# Patient Record
Sex: Male | Born: 1960
Health system: Southern US, Community
[De-identification: ages and names within clinical notes are randomized; demographics above are authoritative.]

## PROBLEM LIST (undated history)

## (undated) DIAGNOSIS — J45909 Unspecified asthma, uncomplicated: Secondary | ICD-10-CM

## (undated) HISTORY — PX: EYE SURGERY: SHX253

---

## 1997-09-03 ENCOUNTER — Ambulatory Visit (HOSPITAL_BASED_OUTPATIENT_CLINIC_OR_DEPARTMENT_OTHER): Admission: RE | Admit: 1997-09-03 | Discharge: 1997-09-03 | Payer: Self-pay | Admitting: General Surgery

## 1998-08-22 ENCOUNTER — Encounter: Admission: RE | Admit: 1998-08-22 | Discharge: 1998-08-22 | Payer: Self-pay | Admitting: Family Medicine

## 2014-11-17 ENCOUNTER — Encounter (HOSPITAL_COMMUNITY): Payer: Self-pay | Admitting: Emergency Medicine

## 2014-11-17 ENCOUNTER — Emergency Department (HOSPITAL_COMMUNITY)
Admission: EM | Admit: 2014-11-17 | Discharge: 2014-11-17 | Disposition: A | Discharge status: Home / Self Care | Payer: Medicare Other | Attending: Emergency Medicine | Admitting: Emergency Medicine

## 2014-11-17 DIAGNOSIS — Z7952 Long term (current) use of systemic steroids: Secondary | ICD-10-CM | POA: Insufficient documentation

## 2014-11-17 DIAGNOSIS — J45909 Unspecified asthma, uncomplicated: Secondary | ICD-10-CM | POA: Insufficient documentation

## 2014-11-17 DIAGNOSIS — K219 Gastro-esophageal reflux disease without esophagitis: Secondary | ICD-10-CM | POA: Diagnosis not present

## 2014-11-17 DIAGNOSIS — R0989 Other specified symptoms and signs involving the circulatory and respiratory systems: Secondary | ICD-10-CM | POA: Diagnosis present

## 2014-11-17 DIAGNOSIS — F458 Other somatoform disorders: Secondary | ICD-10-CM | POA: Diagnosis not present

## 2014-11-17 DIAGNOSIS — Z79899 Other long term (current) drug therapy: Secondary | ICD-10-CM | POA: Insufficient documentation

## 2014-11-17 DIAGNOSIS — R198 Other specified symptoms and signs involving the digestive system and abdomen: Secondary | ICD-10-CM

## 2014-11-17 HISTORY — DX: Unspecified asthma, uncomplicated: J45.909

## 2014-11-17 NOTE — ED Notes (Signed)
Pt reports at chicken 1430 yesterday; since felt like something stuck in throat but cannot cough it up; pt continues to reports nervous to attempt coughing up because of hx of choking. Pt denies SOB, difficulty breathing, or pain.

## 2014-11-17 NOTE — ED Notes (Signed)
Pt states he was eating some chicken with the small rib bones in it. States he thinks a bone got stuck in his throat. States he was afraid to go to sleep last night because he thought he'd swallow or choke on it. States he doesn't always feel it, that the feeling comes and goes, denies any pain in throat area or trouble breathing. Pt presents in no distress in triage.

## 2014-11-17 NOTE — ED Provider Notes (Signed)
CSN: [id number]     Arrival date & time 11/17/14  1820 History   First MD Initiated Contact with Patient 11/17/14 1901     Chief Complaint  Patient presents with  . Swallowed Foreign Body     (Consider location/radiation/quality/duration/timing/severity/associated sxs/prior Treatment) HPI   54 year old male with history of asthma who presents for evaluation of globus sensation. Patient states he ate some chicken last night. He believes he may have a bone stuck in his throat.  Report globus sensation in the back of his throat. He denies any significant pain but was concerned of possible choking on the foreign object. Sensation is waxing waning. He is able to tolerates both liquid and solid but at times he experiencing this globus sensation. Also reports his mouth is dry. No complaint of nausea, vomiting, hematemesis, chest pain, shortness of breath, or black stool. No complaint of abdominal pain or back pain. Patient without history of active cancer, denies abnormal weight changes, night sweats, or fever. Does report intermittent similar episodes in the past, never this severe. Does report having history of GERD, which he takes Gaviscon as needed.  Past Medical History  Diagnosis Date  . Asthma    Past Surgical History  Procedure Laterality Date  . Eye surgery      Legally Blind   History reviewed. No pertinent family history. History  Substance Use Topics  . Smoking status: Not on file  . Smokeless tobacco: Not on file  . Alcohol Use: Not on file    Review of Systems  All other systems reviewed and are negative.     Allergies  Codeine  Home Medications   Prior to Admission medications   Medication Sig Start Date End Date Taking? Authorizing Provider  brimonidine (ALPHAGAN) 0.2 % ophthalmic solution Place 1 drop into both eyes 3 (three) times daily. 10/12/14  Yes Historical Provider, MD  prednisoLONE acetate (PRED FORTE) 1 % ophthalmic suspension Place 1 drop into the right  eye 4 (four) times daily. 11/09/14  Yes Historical Provider, MD  timolol (BETIMOL) 0.25 % ophthalmic solution Place 1 drop into both eyes at bedtime.   Yes Historical Provider, MD   BP 165/85 mmHg  Pulse 81  Temp(Src) 98.2 F (36.8 C) (Oral)  Resp 18  SpO2 97% Physical Exam  Constitutional: He is oriented to person, place, and time. He appears well-developed and well-nourished. No distress.  HENT:  Head: Atraumatic.  Throat: Uvula is midline, no obvious obstruction noted. No trismus. No tongue edema.  Eyes: Conjunctivae are normal.  Neck: Neck supple. No JVD present. No tracheal deviation present. No thyromegaly present.  Cardiovascular: Normal rate and regular rhythm.   Pulmonary/Chest: Effort normal and breath sounds normal. No stridor. No respiratory distress.  Abdominal: Soft. Bowel sounds are normal. He exhibits no distension. There is no tenderness.  Lymphadenopathy:    He has no cervical adenopathy.  Neurological: He is alert and oriented to person, place, and time.  Skin: No rash noted.  Psychiatric: He has a normal mood and affect.  Nursing note and vitals reviewed.   ED Course  Procedures (including critical care time)  Patient here with globus sensation. He is able to tolerate both solids and liquids without difficulty. Low suspicion for retained foreign object such as chicken bone. I do not think x-ray can provide any significant clinical support. Reassurance given. GI referral given as needed. Return precautions discussed.  Labs Review Labs Reviewed - No data to display  Imaging Review No results found.  EKG Interpretation None      MDM   Final diagnoses:  Globus sensation    BP 165/85 mmHg  Pulse 81  Temp(Src) 98.2 F (36.8 C) (Oral)  Resp 18  SpO2 97%     Domenic Moras, PA-C 11/17/14 2008  Lacretia Leigh, MD 11/17/14 2126

## 2014-11-17 NOTE — Discharge Instructions (Signed)
Globus Syndrome  Globus Syndrome is a feeling of a lump or a sensation of something caught in your throat. Eating food or drinking fluids does not seem to get rid of it. Yet it is not noticeable during the actual act of swallowing food or liquids. Usually there is nothing physically wrong. It is troublesome because it is an unpleasant sensation which is sometimes difficult to ignore and at times may seem to worsen. The syndrome is quite common. It is estimated 45% of the population experiences features of the condition at some stage during their lives. The symptoms are usually temporary. The largest group of people who feel the need to seek medical treatment is females between the ages of 30 to 60.   CAUSES   Globus Syndrome appears to be triggered by or aggravated by stress, anxiety and depression.  · Tension related to stress could product abnormal muscle spasms in the esophagus which would account for the sensation of a lump or ball in your throat.  · Frequent swallowing or drying of the throat caused by anxiety or other strong emotions can also produce this uncomfortable sensation in your throat.  · Fear and sadness can be expressed by the body in many ways. For instance, if you had a relative with throat cancer you might become overly concerned about your own health and develop uncomfortable sensations in your throat.  · The reaction to a crisis or a trauma event in your life can take the form of a lump in your throat. It is as if you are indirectly saying you can not handle or "swallow" one more thing.  DIAGNOSIS   Usually your caregiver will know what is wrong by talking to you and examining you.  If the condition persists for several days, more testing may be done to make sure there is not another problem present. This is usually not the case.  TREATMENT   · Reassurance is often the best treatment available. Usually the problem leaves without treatment over several days.  · Sometimes anti-anxiety medications  may be prescribed.  · Counseling or talk therapy can also help with strong underlying emotions.  · Note that in most cases this is not something that keeps coming back and you should not be concerned or worried.  Document Released: 08/08/2003 Document Revised: 08/10/2011 Document Reviewed: 01/05/2008  ExitCare® Patient Information ©2015 [employer]. This information is not intended to replace advice given to you by your health care provider. Make sure you discuss any questions you have with your health care provider.

## 2015-08-05 DIAGNOSIS — R030 Elevated blood-pressure reading, without diagnosis of hypertension: Secondary | ICD-10-CM | POA: Diagnosis not present

## 2015-08-05 DIAGNOSIS — L739 Follicular disorder, unspecified: Secondary | ICD-10-CM | POA: Diagnosis not present

## 2015-08-26 DIAGNOSIS — N529 Male erectile dysfunction, unspecified: Secondary | ICD-10-CM | POA: Diagnosis not present

## 2015-08-26 DIAGNOSIS — Z125 Encounter for screening for malignant neoplasm of prostate: Secondary | ICD-10-CM | POA: Diagnosis not present

## 2015-08-26 DIAGNOSIS — Z13220 Encounter for screening for lipoid disorders: Secondary | ICD-10-CM | POA: Diagnosis not present

## 2015-08-26 DIAGNOSIS — Z0000 Encounter for general adult medical examination without abnormal findings: Secondary | ICD-10-CM | POA: Diagnosis not present

## 2015-08-26 DIAGNOSIS — Z8249 Family history of ischemic heart disease and other diseases of the circulatory system: Secondary | ICD-10-CM | POA: Diagnosis not present

## 2016-02-05 DIAGNOSIS — H401133 Primary open-angle glaucoma, bilateral, severe stage: Secondary | ICD-10-CM | POA: Diagnosis not present

## 2016-08-05 DIAGNOSIS — L309 Dermatitis, unspecified: Secondary | ICD-10-CM | POA: Diagnosis not present

## 2016-08-05 DIAGNOSIS — Z23 Encounter for immunization: Secondary | ICD-10-CM | POA: Diagnosis not present

## 2016-09-07 DIAGNOSIS — D485 Neoplasm of uncertain behavior of skin: Secondary | ICD-10-CM | POA: Diagnosis not present

## 2016-09-07 DIAGNOSIS — L821 Other seborrheic keratosis: Secondary | ICD-10-CM | POA: Diagnosis not present

## 2016-09-07 DIAGNOSIS — L739 Follicular disorder, unspecified: Secondary | ICD-10-CM | POA: Diagnosis not present

## 2017-08-03 DIAGNOSIS — H401133 Primary open-angle glaucoma, bilateral, severe stage: Secondary | ICD-10-CM | POA: Diagnosis not present

## 2017-08-03 DIAGNOSIS — H15833 Staphyloma posticum, bilateral: Secondary | ICD-10-CM | POA: Diagnosis not present

## 2017-08-03 DIAGNOSIS — H4423 Degenerative myopia, bilateral: Secondary | ICD-10-CM | POA: Diagnosis not present

## 2017-10-12 DIAGNOSIS — N529 Male erectile dysfunction, unspecified: Secondary | ICD-10-CM | POA: Diagnosis not present

## 2017-10-12 DIAGNOSIS — R350 Frequency of micturition: Secondary | ICD-10-CM | POA: Diagnosis not present

## 2017-10-12 DIAGNOSIS — Z136 Encounter for screening for cardiovascular disorders: Secondary | ICD-10-CM | POA: Diagnosis not present

## 2017-10-12 DIAGNOSIS — Z125 Encounter for screening for malignant neoplasm of prostate: Secondary | ICD-10-CM | POA: Diagnosis not present

## 2017-10-12 DIAGNOSIS — Z0000 Encounter for general adult medical examination without abnormal findings: Secondary | ICD-10-CM | POA: Diagnosis not present

## 2017-10-12 DIAGNOSIS — H409 Unspecified glaucoma: Secondary | ICD-10-CM | POA: Diagnosis not present

## 2018-02-10 DIAGNOSIS — H401133 Primary open-angle glaucoma, bilateral, severe stage: Secondary | ICD-10-CM | POA: Diagnosis not present

## 2018-03-05 ENCOUNTER — Emergency Department (HOSPITAL_COMMUNITY): Payer: Medicare HMO

## 2018-03-05 ENCOUNTER — Encounter (HOSPITAL_COMMUNITY): Payer: Self-pay | Admitting: Emergency Medicine

## 2018-03-05 ENCOUNTER — Observation Stay (HOSPITAL_COMMUNITY)
Admission: EM | Admit: 2018-03-05 | Discharge: 2018-03-07 | Disposition: A | Discharge status: Home / Self Care | Payer: Medicare HMO | Attending: Internal Medicine | Admitting: Internal Medicine

## 2018-03-05 DIAGNOSIS — H5440 Blindness, one eye, unspecified eye: Secondary | ICD-10-CM

## 2018-03-05 DIAGNOSIS — R69 Illness, unspecified: Secondary | ICD-10-CM | POA: Diagnosis not present

## 2018-03-05 DIAGNOSIS — H5450 Low vision, one eye, unspecified eye: Secondary | ICD-10-CM

## 2018-03-05 DIAGNOSIS — R030 Elevated blood-pressure reading, without diagnosis of hypertension: Secondary | ICD-10-CM | POA: Diagnosis present

## 2018-03-05 DIAGNOSIS — K219 Gastro-esophageal reflux disease without esophagitis: Secondary | ICD-10-CM | POA: Diagnosis not present

## 2018-03-05 DIAGNOSIS — E785 Hyperlipidemia, unspecified: Secondary | ICD-10-CM | POA: Diagnosis not present

## 2018-03-05 DIAGNOSIS — Z6833 Body mass index (BMI) 33.0-33.9, adult: Secondary | ICD-10-CM | POA: Insufficient documentation

## 2018-03-05 DIAGNOSIS — E669 Obesity, unspecified: Secondary | ICD-10-CM | POA: Insufficient documentation

## 2018-03-05 DIAGNOSIS — J9811 Atelectasis: Secondary | ICD-10-CM | POA: Diagnosis not present

## 2018-03-05 DIAGNOSIS — F419 Anxiety disorder, unspecified: Secondary | ICD-10-CM | POA: Diagnosis not present

## 2018-03-05 DIAGNOSIS — Z23 Encounter for immunization: Secondary | ICD-10-CM | POA: Diagnosis not present

## 2018-03-05 DIAGNOSIS — R0789 Other chest pain: Principal | ICD-10-CM | POA: Insufficient documentation

## 2018-03-05 DIAGNOSIS — Z79899 Other long term (current) drug therapy: Secondary | ICD-10-CM | POA: Diagnosis not present

## 2018-03-05 DIAGNOSIS — Z885 Allergy status to narcotic agent status: Secondary | ICD-10-CM | POA: Insufficient documentation

## 2018-03-05 DIAGNOSIS — R9431 Abnormal electrocardiogram [ECG] [EKG]: Secondary | ICD-10-CM | POA: Diagnosis not present

## 2018-03-05 DIAGNOSIS — R079 Chest pain, unspecified: Secondary | ICD-10-CM | POA: Diagnosis not present

## 2018-03-05 DIAGNOSIS — H548 Legal blindness, as defined in USA: Secondary | ICD-10-CM | POA: Diagnosis not present

## 2018-03-05 DIAGNOSIS — H547 Unspecified visual loss: Secondary | ICD-10-CM

## 2018-03-05 DIAGNOSIS — Z8249 Family history of ischemic heart disease and other diseases of the circulatory system: Secondary | ICD-10-CM | POA: Diagnosis not present

## 2018-03-05 LAB — CBC
HCT: 49.5 % (ref 39.0–52.0)
HEMOGLOBIN: 17.6 g/dL — AB (ref 13.0–17.0)
MCH: 32.1 pg (ref 26.0–34.0)
MCHC: 35.6 g/dL (ref 30.0–36.0)
MCV: 90.2 fL (ref 78.0–100.0)
PLATELETS: 268 10*3/uL (ref 150–400)
RBC: 5.49 MIL/uL (ref 4.22–5.81)
RDW: 12.4 % (ref 11.5–15.5)
WBC: 6.3 10*3/uL (ref 4.0–10.5)

## 2018-03-05 LAB — URINALYSIS, ROUTINE W REFLEX MICROSCOPIC
BILIRUBIN URINE: NEGATIVE
Glucose, UA: NEGATIVE mg/dL
HGB URINE DIPSTICK: NEGATIVE
KETONES UR: NEGATIVE mg/dL
Leukocytes, UA: NEGATIVE
NITRITE: NEGATIVE
Protein, ur: NEGATIVE mg/dL
Specific Gravity, Urine: 1.01 (ref 1.005–1.030)
pH: 8 (ref 5.0–8.0)

## 2018-03-05 LAB — BASIC METABOLIC PANEL
Anion gap: 11 (ref 5–15)
BUN: 10 mg/dL (ref 6–20)
CO2: 27 mmol/L (ref 22–32)
CREATININE: 0.96 mg/dL (ref 0.61–1.24)
Calcium: 10.1 mg/dL (ref 8.9–10.3)
Chloride: 104 mmol/L (ref 98–111)
GLUCOSE: 115 mg/dL — AB (ref 70–99)
Potassium: 5 mmol/L (ref 3.5–5.1)
Sodium: 142 mmol/L (ref 135–145)

## 2018-03-05 LAB — HEPATIC FUNCTION PANEL
ALT: 58 U/L — ABNORMAL HIGH (ref 0–44)
AST: 36 U/L (ref 15–41)
Albumin: 4.9 g/dL (ref 3.5–5.0)
Alkaline Phosphatase: 55 U/L (ref 38–126)
BILIRUBIN DIRECT: 0.1 mg/dL (ref 0.0–0.2)
BILIRUBIN INDIRECT: 0.7 mg/dL (ref 0.3–0.9)
BILIRUBIN TOTAL: 0.8 mg/dL (ref 0.3–1.2)
Total Protein: 8 g/dL (ref 6.5–8.1)

## 2018-03-05 LAB — CBG MONITORING, ED: Glucose-Capillary: 111 mg/dL — ABNORMAL HIGH (ref 70–99)

## 2018-03-05 LAB — I-STAT TROPONIN, ED: TROPONIN I, POC: 0 ng/mL (ref 0.00–0.08)

## 2018-03-05 LAB — RAPID URINE DRUG SCREEN, HOSP PERFORMED
Amphetamines: NOT DETECTED
Barbiturates: NOT DETECTED
Benzodiazepines: NOT DETECTED
COCAINE: NOT DETECTED
OPIATES: NOT DETECTED
TETRAHYDROCANNABINOL: NOT DETECTED

## 2018-03-05 LAB — TROPONIN I: Troponin I: <0.03 ng/mL (ref <0.03)

## 2018-03-05 LAB — LIPASE, BLOOD: LIPASE: 35 U/L (ref 11–51)

## 2018-03-05 MED ORDER — ASPIRIN 81 MG PO CHEW
324.0000 mg | CHEWABLE_TABLET | Freq: Once | ORAL | Status: AC
  Administered 2018-03-05: 324 mg via ORAL
  Filled 2018-03-05: qty 4

## 2018-03-05 MED ORDER — METOPROLOL TARTRATE 25 MG PO TABS: 25.0000 mg | ORAL_TABLET | Freq: Two times a day (BID) | ORAL | Status: DC

## 2018-03-05 MED ORDER — ACETAMINOPHEN 650 MG RE SUPP: 650.0000 mg | Freq: Four times a day (QID) | RECTAL | Status: DC | PRN

## 2018-03-05 MED ORDER — GUAIFENESIN ER 600 MG PO TB12
600.0000 mg | ORAL_TABLET | Freq: Two times a day (BID) | ORAL | Status: DC
  Administered 2018-03-05 – 2018-03-07 (×4): 600 mg via ORAL
  Filled 2018-03-05 (×4): qty 1

## 2018-03-05 MED ORDER — HYDROCODONE-ACETAMINOPHEN 5-325 MG PO TABS: 1.0000 | ORAL_TABLET | ORAL | Status: DC | PRN

## 2018-03-05 MED ORDER — ENOXAPARIN SODIUM 40 MG/0.4ML ~~LOC~~ SOLN
40.0000 mg | SUBCUTANEOUS | Status: DC
  Administered 2018-03-05 – 2018-03-06 (×2): 40 mg via SUBCUTANEOUS
  Filled 2018-03-05 (×2): qty 0.4

## 2018-03-05 MED ORDER — GI COCKTAIL ~~LOC~~
30.0000 mL | Freq: Once | ORAL | Status: DC
  Filled 2018-03-05: qty 30

## 2018-03-05 MED ORDER — INFLUENZA VAC SPLIT QUAD 0.5 ML IM SUSY
0.5000 mL | PREFILLED_SYRINGE | INTRAMUSCULAR | Status: AC
  Administered 2018-03-06: 0.5 mL via INTRAMUSCULAR
  Filled 2018-03-05: qty 0.5

## 2018-03-05 MED ORDER — DORZOLAMIDE HCL 2 % OP SOLN
1.0000 [drp] | Freq: Two times a day (BID) | OPHTHALMIC | Status: DC
  Administered 2018-03-05 – 2018-03-07 (×4): 1 [drp] via OPHTHALMIC
  Filled 2018-03-05: qty 10

## 2018-03-05 MED ORDER — LATANOPROST 0.005 % OP SOLN
1.0000 [drp] | Freq: Every day | OPHTHALMIC | Status: DC
  Administered 2018-03-05 – 2018-03-06 (×2): 1 [drp] via OPHTHALMIC
  Filled 2018-03-05: qty 2.5

## 2018-03-05 MED ORDER — TIMOLOL MALEATE 0.5 % OP SOLN
1.0000 [drp] | Freq: Two times a day (BID) | OPHTHALMIC | Status: DC
  Administered 2018-03-05 – 2018-03-07 (×4): 1 [drp] via OPHTHALMIC
  Filled 2018-03-05: qty 5

## 2018-03-05 MED ORDER — BRIMONIDINE TARTRATE 0.2 % OP SOLN
1.0000 [drp] | Freq: Three times a day (TID) | OPHTHALMIC | Status: DC
  Administered 2018-03-05 – 2018-03-07 (×5): 1 [drp] via OPHTHALMIC
  Filled 2018-03-05: qty 5

## 2018-03-05 MED ORDER — LORATADINE 10 MG PO TABS
10.0000 mg | ORAL_TABLET | Freq: Every day | ORAL | Status: DC
  Administered 2018-03-05 – 2018-03-07 (×3): 10 mg via ORAL
  Filled 2018-03-05 (×4): qty 1

## 2018-03-05 MED ORDER — ALPRAZOLAM 0.25 MG PO TABS: 0.2500 mg | ORAL_TABLET | Freq: Every evening | ORAL | Status: DC | PRN

## 2018-03-05 MED ORDER — ACETAMINOPHEN 325 MG PO TABS: 650.0000 mg | ORAL_TABLET | Freq: Four times a day (QID) | ORAL | Status: DC | PRN

## 2018-03-05 MED ORDER — SODIUM CHLORIDE 0.9 % IV SOLN
INTRAVENOUS | Status: AC
  Administered 2018-03-05: 22:00:00 via INTRAVENOUS

## 2018-03-05 MED ORDER — ONDANSETRON HCL 4 MG/2ML IJ SOLN: 4.0000 mg | Freq: Four times a day (QID) | INTRAMUSCULAR | Status: DC | PRN

## 2018-03-05 MED ORDER — PNEUMOCOCCAL VAC POLYVALENT 25 MCG/0.5ML IJ INJ
0.5000 mL | INJECTION | INTRAMUSCULAR | Status: AC
  Administered 2018-03-06: 0.5 mL via INTRAMUSCULAR
  Filled 2018-03-05: qty 0.5

## 2018-03-05 MED ORDER — ONDANSETRON HCL 4 MG PO TABS: 4.0000 mg | ORAL_TABLET | Freq: Four times a day (QID) | ORAL | Status: DC | PRN

## 2018-03-05 MED ORDER — METOPROLOL TARTRATE 25 MG PO TABS
12.5000 mg | ORAL_TABLET | Freq: Two times a day (BID) | ORAL | Status: DC
  Administered 2018-03-05: 12.5 mg via ORAL
  Filled 2018-03-05 (×2): qty 1

## 2018-03-05 MED ORDER — ALBUTEROL SULFATE (2.5 MG/3ML) 0.083% IN NEBU
2.5000 mg | INHALATION_SOLUTION | RESPIRATORY_TRACT | Status: DC | PRN
  Administered 2018-03-06: 2.5 mg via RESPIRATORY_TRACT
  Filled 2018-03-05: qty 3

## 2018-03-05 MED ORDER — TIMOLOL MALEATE 0.25 % OP SOLN
1.0000 [drp] | Freq: Every day | OPHTHALMIC | Status: DC
  Filled 2018-03-05: qty 5

## 2018-03-05 NOTE — H&P (Signed)
John Bell EZM:[id number] DOB: 1961/03/19 DOA: 03/05/2018     PCP: Dineen Kid, MD   Outpatient Specialists:  [facility]   Patient arrived to ER on 03/05/18 at 1335  Patient coming from: home Lives alone,    Chief Complaint:  Chief Complaint  Patient presents with  . Chest Pain    HPI: John Bell is a 57 y.o. male with medical history significant of Dysconjugate gaze optic neuritis family history of coronary artery disease    Presented with  Chest discomfort on a scale of 10 only 2-3  Describes a weird pressure-like feeling he is worried that he may be anxious no associated nausea vomiting. Reports occasionally wake up unable to take deep breath. He exercises on the regular bassis on Eliptic machine and have never had Shortness of breath or chest pain.  He at times have indigestion that make him feel similar Occasional dyspnea on exertion pain radiating to left arm.  Never had similar presentation in the past. Patient is concerned because his father required CABG at the same age.  Regarding pertinent Chronic problems: No prior history of coronary artery disease or diabetes of hypertension No prior echogram in the system While in ER: ECG: ST depressions T-waves inversion   The following Work up has been ordered so far:  Orders Placed This Encounter  Procedures  . DG Chest 2 View  . DG Chest Portable 1 View  . Basic metabolic panel  . CBC  . Hepatic function panel  . Lipase, blood  . Cardiac monitoring  . Saline Lock IV, Maintain IV access  . Cardiac monitoring  . Initiate Carrier Fluid Protocol  . Consult to hospitalist  . Pulse oximetry, continuous  . Pulse oximetry, continuous  . I-stat troponin, ED  . CBG monitoring, ED  . I-stat troponin, ED (0, 3)  . EKG 12-Lead  . ED EKG within 10 minutes  . Insert peripheral IV  . Insert peripheral IV    Following Medications were ordered in ER: Medications  aspirin chewable tablet 324 mg (324 mg Oral Given  03/05/18 1429)    Significant initial  Findings: Abnormal Labs Reviewed  BASIC METABOLIC PANEL - Abnormal; Notable for the following components:      Result Value   Glucose, Bld 115 (*)    All other components within normal limits  CBC - Abnormal; Notable for the following components:   Hemoglobin 17.6 (*)    All other components within normal limits  HEPATIC FUNCTION PANEL - Abnormal; Notable for the following components:   ALT 58 (*)    All other components within normal limits  CBG MONITORING, ED - Abnormal; Notable for the following components:   Glucose-Capillary 111 (*)    All other components within normal limits   Na 142 K 5.0  Cr     stable,    Lab Results  Component Value Date   CREATININE 0.96 03/05/2018   Lipase 35  WBC 6.3  HG/HCT     Component Value Date/Time   HGB 17.6 (H) 03/05/2018 1358   HCT 49.5 03/05/2018 1358     Troponin (Point of Care Test) Recent Labs    03/05/18 1436  TROPIPOC 0.00       UA ordered  CXR -  NON acute    ECG:  Personally reviewed by me showing: HR : 82 Rhythm: NSR,   nonspecific changes, T-wave inversions in multiple leads QTC 483    ED Triage Vitals  Enc Vitals  Group     BP 03/05/18 1341 (!) 199/166     Pulse Rate 03/05/18 1341 85     Resp 03/05/18 1341 19     Temp --      Temp src --      SpO2 03/05/18 1341 100 %     Weight --      Height --      Head Circumference --      Peak Flow --      Pain Score 03/05/18 1355 1     Pain Loc --      Pain Edu? --      Excl. in [place]? --   TMAX(24)@       Latest  Blood pressure (!) 159/83, pulse 72, resp. rate 16, SpO2 97 %.   ER Provider Called:  Cardiology    Dr. Gwenlyn Found    Will see in AM   Hospitalist was called for admission for chest pain evaluation    Review of Systems:    Pertinent positives include: chest pain, dyspnea on exertion  Constitutional:  No weight loss, night sweats, Fevers, chills, fatigue, weight loss  HEENT:  No headaches, Difficulty  swallowing,Tooth/dental problems, Sore throat,  No sneezing, itching, ear ache, nasal congestion, post nasal drip,  Cardio-vascular:  No  Orthopnea, PND, anasarca, dizziness, palpitations.no Bilateral lower extremity swelling  GI:  No heartburn, indigestion, abdominal pain, nausea, vomiting, diarrhea, change in bowel habits, loss of appetite, melena, blood in stool, hematemesis Resp:  no shortness of breath at rest. No , No excess mucus, no productive cough, No non-productive cough, No coughing up of blood.No change in color of mucus.No wheezing. Skin:  no rash or lesions. No jaundice GU:  no dysuria, change in color of urine, no urgency or frequency. No straining to urinate.  No flank pain.  Musculoskeletal:  No joint pain or no joint swelling. No decreased range of motion. No back pain.  Psych:  No change in mood or affect. No depression or anxiety. No memory loss.  Neuro: no localizing neurological complaints, no tingling, no weakness, no double vision, no gait abnormality, no slurred speech, no confusion  All systems reviewed and apart from [place] all are negative  Past Medical History:   Past Medical History:  Diagnosis Date  . Asthma       Past Surgical History:  Procedure Laterality Date  . EYE SURGERY     Legally Blind    Social History:  Ambulatory  Independently     reports that he has never smoked. He has never used smokeless tobacco. He reports that he does not drink alcohol or use drugs.     Family History:   Family History  Problem Relation Age of Onset  . CAD Father     Allergies: Allergies  Allergen Reactions  . Codeine Nausea Only     Prior to Admission medications   Medication Sig Start Date End Date Taking? Authorizing Provider  brimonidine (ALPHAGAN) 0.2 % ophthalmic solution Place 1 drop into both eyes 3 (three) times daily. 10/12/14   [provider]  prednisoLONE acetate (PRED FORTE) 1 % ophthalmic suspension Place 1 drop into  the right eye 4 (four) times daily. 11/09/14   [provider]  timolol (BETIMOL) 0.25 % ophthalmic solution Place 1 drop into both eyes at bedtime.    [provider]   Physical Exam: Blood pressure (!) 159/83, pulse 72, resp. rate 16, SpO2 97 %. 1. General:  in No  Acute distress  well -appearing  Disconjugate gaze 2. Psychological: Alert and   Oriented 3. Head/ENT:    Dry Mucous Membranes                          Head Non traumatic, neck supple                           Poor Dentition 4. SKIN:   decreased Skin turgor,  Skin clean Dry and intact no rash 5. Heart: Regular rate and rhythm no Murmur, no Rub or gallop 6. Lungs:  Clear to auscultation bilaterally, no wheezes or crackles   7. Abdomen: Soft,  non-tender, Non distended   obese  bowel sounds present 8. Lower extremities: no clubbing, cyanosis, or  edema 9. Neurologically Grossly intact, moving all 4 extremities equally   10. MSK: Normal range of motion   LABS:     Recent Labs  Lab 03/05/18 1358  WBC 6.3  HGB 17.6*  HCT 49.5  MCV 90.2  PLT 546   Basic Metabolic Panel: Recent Labs  Lab 03/05/18 1358  NA 142  K 5.0  CL 104  CO2 27  GLUCOSE 115*  BUN 10  CREATININE 0.96  CALCIUM 10.1      Recent Labs  Lab 03/05/18 1418  AST 36  ALT 58*  ALKPHOS 55  BILITOT 0.8  PROT 8.0  ALBUMIN 4.9   Recent Labs  Lab 03/05/18 1418  LIPASE 35   No results for input(s): AMMONIA in the last 168 hours.    HbA1C: No results for input(s): HGBA1C in the last 72 hours. CBG: Recent Labs  Lab 03/05/18 1428  GLUCAP 111*      Urine analysis: No results found for: COLORURINE, APPEARANCEUR, LABSPEC, PHURINE, GLUCOSEU, HGBUR, BILIRUBINUR, KETONESUR, PROTEINUR, UROBILINOGEN, NITRITE, LEUKOCYTESUR     Cultures: No results found for: [place], SPECREQUEST, CULT, REPTSTATUS   Radiological Exams on Admission: Dg Chest Portable 1 View  Result Date: 03/05/2018 CLINICAL DATA:  Central chest tightness  EXAM: PORTABLE CHEST 1 VIEW COMPARISON:  None. FINDINGS: Elevation of the left hemidiaphragm. Bibasilar atelectasis. Heart is normal size. No effusions or acute bony abnormality. IMPRESSION: Elevated left hemidiaphragm.  Bibasilar atelectasis. Electronically Signed   By: Rolm Baptise M.D.   On: 03/05/2018 14:40    Chart has been reviewed    Assessment/Plan   57 y.o. male with medical history significant of Dysconjugate gaze optic neuritis family history of coronary artery disease  Admitted for Chest pain evaluation   Present on Admission: . Chest pain - - H=   1  ,E= 1 , A=  1 , R   1 , T 0 *,  for the  Total of 4  therefore will admit for observation and further evaluation ( Risk of MACE: Scores 0-3  of 0.9-1.7%.,  4-6: 12-16.6% , Scores ?7: 50-65% )   - Other explanation for chest pain could be GI related versus musculoskeletal   - monitor on telemetry, cycle cardiac enzymes, obtain serial ECG and  ECHO in AM.   - Daily aspirin -  Further risk stratify with lipid panel, hgA1C, obtain TSH.  Make sure patient is on Aspirin.  We will notify cardiology regarding patient's admission. Further management depends on pending  workup Dr. Gwenlyn Found is aware . Elevated BP without diagnosis of hypertension -start beta-blocker and titrate as able . Anxiety -for now treated with Xanax as needed  but long-term wise probably will need different approach and follow-up with primary care   Other plan as per orders.  DVT prophylaxis:    Lovenox     Code Status:  FULL CODE  as per patient   I had personally discussed CODE STATUS with patient      Family Communication:   Family not at  Bedside    Disposition Plan:      To home once workup is complete and patient is stable                                    Consults called: Cardiology   Admission status:   Obs    Level of care   tele   24H        Tajuana Kniskern 03/05/2018, 4:55 PM    [facility]  Pager [phone]   after 2 AM  please page floor coverage PA If 7AM-7PM, please contact the day team taking care of the patient  [email or web address]  Password [email or web address]

## 2018-03-05 NOTE — ED Provider Notes (Addendum)
[facility] DEPT Provider Note   CSN: [id number] Arrival date & time: 03/05/18  1335     History   Chief Complaint Chief Complaint  Patient presents with  . Chest Pain    HPI John Bell is a 57 y.o. male.  HPI 57 year old man with family history of early coronary artery disease presents today with chest tightness that started this morning.  He was in the house doing some activities around the house and began to feel some lower chest tightness.  He has some associated dyspnea.  Had some discomfort in his left arm.  He denies any previous similar symptoms.  He has a strong family history of coronary artery disease.  He states his father was about his age when he required coronary artery bypass grafting.  He has had a EKG performed at primary care sometime in the distant past.  He denies any previous evaluation by cardiology. Past Medical History:  Diagnosis Date  . Asthma     There are no active problems to display for this patient.   Past Surgical History:  Procedure Laterality Date  . EYE SURGERY     Legally Blind        Home Medications    Prior to Admission medications   Medication Sig Start Date End Date Taking? Authorizing Provider  brimonidine (ALPHAGAN) 0.2 % ophthalmic solution Place 1 drop into both eyes 3 (three) times daily. 10/12/14   [provider]  prednisoLONE acetate (PRED FORTE) 1 % ophthalmic suspension Place 1 drop into the right eye 4 (four) times daily. 11/09/14   [provider]  timolol (BETIMOL) 0.25 % ophthalmic solution Place 1 drop into both eyes at bedtime.    [provider]    Family History No family history on file.  Social History Social History   Tobacco Use  . Smoking status: Never Smoker  . Smokeless tobacco: Never Used  Substance Use Topics  . Alcohol use: Never    Frequency: Never  . Drug use: Never     Allergies   Codeine   Review of Systems Review of  Systems  All other systems reviewed and are negative.    Physical Exam Updated Vital Signs BP (!) 179/101 (BP Location: Right Arm)   Pulse 84   Resp 20   SpO2 96%   Physical Exam  Constitutional: He is oriented to person, place, and time. He appears well-developed and well-nourished.  HENT:  Head: Normocephalic and atraumatic.  Eyes: EOM are normal.  Dysconjugate gaze  Neck: Normal range of motion. Neck supple.  Cardiovascular: Normal rate, regular rhythm and normal pulses.  Pulmonary/Chest: Effort normal and breath sounds normal.  Abdominal: Soft. Bowel sounds are normal.  Musculoskeletal: Normal range of motion.       Right lower leg: Normal.       Left lower leg: Normal.  Neurological: He is alert and oriented to person, place, and time.  Skin: Skin is warm and dry. Capillary refill takes less than 2 seconds.  Psychiatric: He has a normal mood and affect. His behavior is normal.  Nursing note and vitals reviewed.    ED Treatments / Results  Labs (all labs ordered are listed, but only abnormal results are displayed) Labs Reviewed  BASIC METABOLIC PANEL  CBC  HEPATIC FUNCTION PANEL  LIPASE, BLOOD  I-STAT TROPONIN, ED  CBG MONITORING, ED  I-STAT TROPONIN, ED    EKG EKG Interpretation  Date/Time:  Saturday March 05 2018 13:49:01 EDT  Ventricular Rate:  82 PR Interval:    QRS Duration: 119 QT Interval:  413 QTC Calculation: 483 R Axis:   38 Text Interpretation:  Normal sinus rhythm Non-specific ST-t changes twave inversion v3 and v4 No old tracing to compare Confirmed by Pattricia Boss [phone]) on 03/05/2018 2:02:56 PM   Radiology Dg Chest Portable 1 View  Result Date: 03/05/2018 CLINICAL DATA:  Central chest tightness EXAM: PORTABLE CHEST 1 VIEW COMPARISON:  None. FINDINGS: Elevation of the left hemidiaphragm. Bibasilar atelectasis. Heart is normal size. No effusions or acute bony abnormality. IMPRESSION: Elevated left hemidiaphragm.  Bibasilar atelectasis.  Electronically Signed   By: Rolm Baptise M.D.   On: 03/05/2018 14:40    Procedures Procedures (including critical care time)  Medications Ordered in ED Medications  aspirin chewable tablet 324 mg (has no administration in time range)     Initial Impression / Assessment and Plan / ED Course  I have reviewed the triage vital signs and the nursing notes.  Pertinent labs & imaging results that were available during my care of the patient were reviewed by me and considered in my medical decision making (see chart for details).     57 yo male with no known cardiac ho presents with sscp, pain resolved here.  ASA given.  EKG with t wave inversion and nst changes in v3-v4 disqualifying patient from heart pathway.  Plan admission for troponin cycling and further tesing.  Discussed with throat results with patient and wife Discussed with Dr. Roel Cluck and she will see for evaluation  Discussed with Dr. Gwenlyn Found and will have patient seen by cardiology in consult Final Clinical Impressions(s) / ED Diagnoses   Final diagnoses:  Chest pain, unspecified type  Abnormal EKG    ED Discharge Orders    None       Pattricia Boss, MD 03/05/18 1609    Pattricia Boss, MD 03/05/18 1630

## 2018-03-05 NOTE — ED Triage Notes (Signed)
Patient here from home with complaints of central chest tightness that start this morning. No nausea, vomiting. Patient states "there is no pain, just a weird feeling".

## 2018-03-05 NOTE — ED Notes (Signed)
ED TO INPATIENT HANDOFF REPORT  Name/Age/Gender John Bell 57 y.o. male  Code Status   Home/SNF/Other Home  Chief Complaint tightness in chest; L. arm tingling  Level of Care/Admitting Diagnosis ED Disposition    ED Disposition Condition [facility] Area: [place] [100102]  Level of Care: Telemetry [5]  Admit to tele based on following criteria: Other see comments  Admit to tele based on following criteria: Monitor for Ischemic changes  Comments: chest pain  Diagnosis: Chest pain [366440]  Admitting Physician: Toy Baker [[id number]]  Attending Physician: Toy Baker [[id number]]  PT Class (Do Not Modify): Observation [104]  PT Acc Code (Do Not Modify): Observation [10022]       Medical History Past Medical History:  Diagnosis Date  . Asthma     Allergies Allergies  Allergen Reactions  . Codeine Nausea Only    IV Location/Drains/Wounds Patient Lines/Drains/Airways Status   Active Line/Drains/Airways    Name:   Placement date:   Placement time:   Site:   Days:   Peripheral IV 03/05/18 Right Forearm   03/05/18    1430    Forearm   less than 1          Labs/Imaging Results for orders placed or performed during the hospital encounter of 03/05/18 (from the past 48 hour(s))  Basic metabolic panel     Status: Abnormal   Collection Time: 03/05/18  1:58 PM  Result Value Ref Range   Sodium 142 135 - 145 mmol/L   Potassium 5.0 3.5 - 5.1 mmol/L   Chloride 104 98 - 111 mmol/L   CO2 27 22 - 32 mmol/L   Glucose, Bld 115 (H) 70 - 99 mg/dL   BUN 10 6 - 20 mg/dL   Creatinine, Ser 0.96 0.61 - 1.24 mg/dL   Calcium 10.1 8.9 - 10.3 mg/dL   GFR calc non Af Amer >60 >60 mL/min   GFR calc Af Amer >60 >60 mL/min    Comment: (NOTE) The eGFR has been calculated using the CKD EPI equation. This calculation has not been validated in all clinical situations. eGFR's persistently <60 mL/min signify possible Chronic Kidney Disease.    Anion gap 11 5 - 15    Comment: Performed at [facility], [place] [address], [place], [place] [address]  CBC     Status: Abnormal   Collection Time: 03/05/18  1:58 PM  Result Value Ref Range   WBC 6.3 4.0 - 10.5 K/uL   RBC 5.49 4.22 - 5.81 MIL/uL   Hemoglobin 17.6 (H) 13.0 - 17.0 g/dL   HCT 49.5 39.0 - 52.0 %   MCV 90.2 78.0 - 100.0 fL   MCH 32.1 26.0 - 34.0 pg   MCHC 35.6 30.0 - 36.0 g/dL   RDW 12.4 11.5 - 15.5 %   Platelets 268 150 - 400 K/uL    Comment: Performed at [facility], [place] [address], [place], [place] [address]  Hepatic function panel     Status: Abnormal   Collection Time: 03/05/18  2:18 PM  Result Value Ref Range   Total Protein 8.0 6.5 - 8.1 g/dL   Albumin 4.9 3.5 - 5.0 g/dL   AST 36 15 - 41 U/L   ALT 58 (H) 0 - 44 U/L   Alkaline Phosphatase 55 38 - 126 U/L   Total Bilirubin 0.8 0.3 - 1.2 mg/dL   Bilirubin, Direct 0.1 0.0 - 0.2 mg/dL   Indirect Bilirubin 0.7  0.3 - 0.9 mg/dL    Comment: Performed at [facility], [place] [address] [place], [place] [address]  Lipase, blood     Status: None   Collection Time: 03/05/18  2:18 PM  Result Value Ref Range   Lipase 35 11 - 51 U/L    Comment: Performed at [facility], [place] [address] [place], [place] [address]  CBG monitoring, ED     Status: Abnormal   Collection Time: 03/05/18  2:28 PM  Result Value Ref Range   Glucose-Capillary 111 (H) 70 - 99 mg/dL  I-stat troponin, ED     Status: None   Collection Time: 03/05/18  2:36 PM  Result Value Ref Range   Troponin i, poc 0.00 0.00 - 0.08 ng/mL   Comment 3            Comment: Due to the release kinetics of cTnI, a negative result within the first hours of the onset of symptoms does not rule out myocardial infarction with certainty. If myocardial infarction is still suspected, repeat the test at appropriate intervals.   Troponin I (q 6hr x 3)     Status: None   Collection Time: 03/05/18  4:09  PM  Result Value Ref Range   Troponin I <0.03 <0.03 ng/mL    Comment: Performed at [facility], [place] [address] [place], [place] [address]  Urine rapid drug screen (hosp performed)     Status: None   Collection Time: 03/05/18  4:15 PM  Result Value Ref Range   Opiates NONE DETECTED NONE DETECTED   Cocaine NONE DETECTED NONE DETECTED   Benzodiazepines NONE DETECTED NONE DETECTED   Amphetamines NONE DETECTED NONE DETECTED   Tetrahydrocannabinol NONE DETECTED NONE DETECTED   Barbiturates NONE DETECTED NONE DETECTED    Comment: (NOTE) DRUG SCREEN FOR MEDICAL PURPOSES ONLY.  IF CONFIRMATION IS NEEDED FOR ANY PURPOSE, NOTIFY LAB WITHIN 5 DAYS. LOWEST DETECTABLE LIMITS FOR URINE DRUG SCREEN Drug Class                     Cutoff (ng/mL) Amphetamine and metabolites    1000 Barbiturate and metabolites    200 Benzodiazepine                 673 Tricyclics and metabolites     300 Opiates and metabolites        300 Cocaine and metabolites        300 THC                            50 Performed at [facility], [place] [address], [place], [place] [address]   Urinalysis, Routine w reflex microscopic     Status: None   Collection Time: 03/05/18  4:22 PM  Result Value Ref Range   Color, Urine YELLOW YELLOW   APPearance CLEAR CLEAR   Specific Gravity, Urine 1.010 1.005 - 1.030   pH 8.0 5.0 - 8.0   Glucose, UA NEGATIVE NEGATIVE mg/dL   Hgb urine dipstick NEGATIVE NEGATIVE   Bilirubin Urine NEGATIVE NEGATIVE   Ketones, ur NEGATIVE NEGATIVE mg/dL   Protein, ur NEGATIVE NEGATIVE mg/dL   Nitrite NEGATIVE NEGATIVE   Leukocytes, UA NEGATIVE NEGATIVE    Comment: Performed at [place] [address] [place],  [address]   Dg Chest Portable 1 View  Result Date: 03/05/2018 CLINICAL DATA:  Central chest tightness EXAM:  PORTABLE CHEST 1 VIEW COMPARISON:  None. FINDINGS: Elevation of the left hemidiaphragm. Bibasilar atelectasis. Heart  is normal size. No effusions or acute bony abnormality. IMPRESSION: Elevated left hemidiaphragm.  Bibasilar atelectasis. Electronically Signed   By: Rolm Baptise M.D.   On: 03/05/2018 14:40    Pending Labs Unresulted Labs (From admission, onward)    Start     Ordered   03/05/18 1609  Troponin I (q 6hr x 3)  Now then every 6 hours,   R     03/05/18 1608   Signed and Held  HIV antibody (Routine Testing)  Tomorrow morning,   R     Signed and Held   Signed and Held  Magnesium  Tomorrow morning,   R    Comments:  Call MD if <1.5    Signed and Held   Signed and Held  Phosphorus  Tomorrow morning,   R     Signed and Held   Signed and Held  TSH  Once,   R    Comments:  Cancel if already done within 1 month and notify MD    Signed and Held   Signed and Held  Comprehensive metabolic panel  Once,   R    Comments:  Cal MD for K<3.5 or >5.0    Signed and Held   Signed and Held  CBC  Once,   R    Comments:  Call for hg <8.0    Signed and Held          Vitals/Pain Today's Vitals   03/05/18 1700 03/05/18 1715 03/05/18 1730 03/05/18 1815  BP: (!) 150/83 137/87 (!) 153/100 (!) 162/96  Pulse:   80 78  Resp: (!) 23 17 (!) 22 20  SpO2: 97% 97% 94% 94%  PainSc:        Isolation Precautions No active isolations  Medications Medications  gi cocktail (Maalox,Lidocaine,Donnatal) (has no administration in time range)  ALPRAZolam (XANAX) tablet 0.25 mg (has no administration in time range)  aspirin chewable tablet 324 mg (324 mg Oral Given 03/05/18 1429)    Mobility walks

## 2018-03-05 NOTE — ED Notes (Signed)
ED Provider at bedside. 

## 2018-03-06 ENCOUNTER — Other Ambulatory Visit: Payer: Self-pay

## 2018-03-06 ENCOUNTER — Observation Stay (HOSPITAL_BASED_OUTPATIENT_CLINIC_OR_DEPARTMENT_OTHER): Payer: Medicare HMO

## 2018-03-06 DIAGNOSIS — E785 Hyperlipidemia, unspecified: Secondary | ICD-10-CM

## 2018-03-06 DIAGNOSIS — H548 Legal blindness, as defined in USA: Secondary | ICD-10-CM | POA: Diagnosis not present

## 2018-03-06 DIAGNOSIS — R079 Chest pain, unspecified: Secondary | ICD-10-CM | POA: Diagnosis not present

## 2018-03-06 DIAGNOSIS — R0789 Other chest pain: Secondary | ICD-10-CM | POA: Diagnosis not present

## 2018-03-06 DIAGNOSIS — K219 Gastro-esophageal reflux disease without esophagitis: Secondary | ICD-10-CM | POA: Diagnosis not present

## 2018-03-06 DIAGNOSIS — R9431 Abnormal electrocardiogram [ECG] [EKG]: Secondary | ICD-10-CM

## 2018-03-06 DIAGNOSIS — Z23 Encounter for immunization: Secondary | ICD-10-CM | POA: Diagnosis not present

## 2018-03-06 DIAGNOSIS — R030 Elevated blood-pressure reading, without diagnosis of hypertension: Secondary | ICD-10-CM | POA: Diagnosis not present

## 2018-03-06 DIAGNOSIS — R69 Illness, unspecified: Secondary | ICD-10-CM | POA: Diagnosis not present

## 2018-03-06 DIAGNOSIS — H5440 Blindness, one eye, unspecified eye: Secondary | ICD-10-CM | POA: Diagnosis not present

## 2018-03-06 DIAGNOSIS — F419 Anxiety disorder, unspecified: Secondary | ICD-10-CM | POA: Diagnosis not present

## 2018-03-06 DIAGNOSIS — Z8249 Family history of ischemic heart disease and other diseases of the circulatory system: Secondary | ICD-10-CM | POA: Diagnosis not present

## 2018-03-06 DIAGNOSIS — Z885 Allergy status to narcotic agent status: Secondary | ICD-10-CM | POA: Diagnosis not present

## 2018-03-06 DIAGNOSIS — H5450 Low vision, one eye, unspecified eye: Secondary | ICD-10-CM | POA: Diagnosis not present

## 2018-03-06 LAB — CBC
HEMATOCRIT: 45.9 % (ref 39.0–52.0)
Hemoglobin: 15.9 g/dL (ref 13.0–17.0)
MCH: 31.6 pg (ref 26.0–34.0)
MCHC: 34.6 g/dL (ref 30.0–36.0)
MCV: 91.3 fL (ref 78.0–100.0)
Platelets: 209 10*3/uL (ref 150–400)
RBC: 5.03 MIL/uL (ref 4.22–5.81)
RDW: 12.6 % (ref 11.5–15.5)
WBC: 6.9 10*3/uL (ref 4.0–10.5)

## 2018-03-06 LAB — COMPREHENSIVE METABOLIC PANEL
ALBUMIN: 3.9 g/dL (ref 3.5–5.0)
ALT: 46 U/L — ABNORMAL HIGH (ref 0–44)
AST: 31 U/L (ref 15–41)
Alkaline Phosphatase: 45 U/L (ref 38–126)
Anion gap: 10 (ref 5–15)
BUN: 10 mg/dL (ref 6–20)
CHLORIDE: 102 mmol/L (ref 98–111)
CO2: 28 mmol/L (ref 22–32)
Calcium: 9.1 mg/dL (ref 8.9–10.3)
Creatinine, Ser: 0.87 mg/dL (ref 0.61–1.24)
GFR calc Af Amer: >60 mL/min (ref >60)
GFR calc non Af Amer: >60 mL/min (ref >60)
GLUCOSE: 101 mg/dL — AB (ref 70–99)
POTASSIUM: 4.2 mmol/L (ref 3.5–5.1)
SODIUM: 140 mmol/L (ref 135–145)
Total Bilirubin: 0.9 mg/dL (ref 0.3–1.2)
Total Protein: 6.5 g/dL (ref 6.5–8.1)

## 2018-03-06 LAB — LIPID PANEL
CHOLESTEROL: 206 mg/dL — AB (ref 0–200)
HDL: 40 mg/dL — AB (ref >40)
LDL Cholesterol: 140 mg/dL — ABNORMAL HIGH (ref 0–99)
TRIGLYCERIDES: 130 mg/dL (ref <150)
Total CHOL/HDL Ratio: 5.2 RATIO
VLDL: 26 mg/dL (ref 0–40)

## 2018-03-06 LAB — ECHOCARDIOGRAM COMPLETE

## 2018-03-06 LAB — TSH: TSH: 1.691 u[IU]/mL (ref 0.350–4.500)

## 2018-03-06 LAB — MAGNESIUM: Magnesium: 2.1 mg/dL (ref 1.7–2.4)

## 2018-03-06 LAB — HIV ANTIBODY (ROUTINE TESTING W REFLEX): HIV Screen 4th Generation wRfx: NONREACTIVE

## 2018-03-06 LAB — TROPONIN I

## 2018-03-06 LAB — PHOSPHORUS: PHOSPHORUS: 3.4 mg/dL (ref 2.5–4.6)

## 2018-03-06 MED ORDER — PERFLUTREN LIPID MICROSPHERE
1.0000 mL | INTRAVENOUS | Status: AC | PRN
  Administered 2018-03-06: 2 mL via INTRAVENOUS
  Filled 2018-03-06: qty 10

## 2018-03-06 NOTE — Progress Notes (Signed)
Called to room by patient who c/o feeling "clammy" and like he's "breathing through a sock". VSS. BP somewhat elevated. Denies chest pain or pressure. Mildly diaphoretic. Audible upper respiratory exp wheezing, otherwise chest clear. Albuterol neb tx given. Pt. reports relief of symptoms.

## 2018-03-06 NOTE — Consult Note (Signed)
Cardiology Consultation:   Patient ID: John Bell MRN: [id number]; DOB: 09-10-1960  Admit date: 03/05/2018 Date of Consult: 03/06/2018  Primary Care Provider: Dineen Kid, MD Primary Cardiologist: New (Dr. Buford Dresser) Primary Electrophysiologist: none   Patient Profile:   John Bell is a 57 y.o. male with a hx of vision loss, GERD who is being seen today for the evaluation of chest pain at the request of Dr. Wynelle Cleveland.  History of Present Illness:   Mr. Petteway noted the onset of chest tightness yesterday morning. He was doing activity and felt some shortness of breath with tightness in his rib cage. Only mildly bothersome, 2-3/10. No clear radiation, no diaphoresis, no nausea. Had a mild discomfort in his arm as well. He is a former EMT, and given his family history he presented to the ER to make sure he wasn't having a heart attack. He is able to exercise regularly on the elliptical and has never had shortness of breath with exertion. He does have some anxiety and GERD, but he has never had symptoms like this before. He also has a remote history of asthma but is on no medication for this. He has not had elevated blood pressure prior to his presentation yesterday.  He is not follow by cardiology, and other than having an ECG in the remote past, he has never had any cardiac workup. He does have dyslipidemia, for which he takes red yeast rice. He would like to avoid statins but is willing to consider based on the results of his testing.   He notes that he father was "missing" a coronary artery and required bypass surgery around his age.   Never smoker. No syncope, palpitations, PND, orthopnea, or LE edema.  Past Medical History:  Diagnosis Date  . Asthma     Past Surgical History:  Procedure Laterality Date  . EYE SURGERY     Legally Blind     Home Medications:  Prior to Admission medications   Medication Sig Start Date End Date Taking? Authorizing Provider    brimonidine (ALPHAGAN) 0.2 % ophthalmic solution Place 1 drop into both eyes 3 (three) times daily. 10/12/14  Yes [provider]  dorzolamide (TRUSOPT) 2 % ophthalmic solution Place 1 drop into both eyes 2 (two) times daily. 03/26/15  Yes [provider]  latanoprost (XALATAN) 0.005 % ophthalmic solution Apply 1 drop to eye at bedtime. 09/30/16  Yes [provider]  Loratadine (CLARITIN) 10 MG CAPS Take 10 mg by mouth daily.   Yes [provider]  Omega-3 Fatty Acids (OMEGA 3 PO) Take 2 capsules by mouth 2 (two) times daily.   Yes [provider]  Red Yeast Rice Extract (RED YEAST RICE PO) Take 2 capsules by mouth daily.   Yes [provider]  timolol (TIMOPTIC) 0.5 % ophthalmic solution Place 1 drop into both eyes 2 (two) times daily. 03/29/14  Yes [provider]  timolol (BETIMOL) 0.25 % ophthalmic solution Place 1 drop into both eyes at bedtime.    [provider]    Inpatient Medications: Scheduled Meds: . brimonidine  1 drop Both Eyes TID  . dorzolamide  1 drop Both Eyes BID  . enoxaparin (LOVENOX) injection  40 mg Subcutaneous Q24H  . gi cocktail  30 mL Oral Once  . guaiFENesin  600 mg Oral BID  . Influenza vac split quadrivalent PF  0.5 mL Intramuscular Tomorrow-1000  . latanoprost  1 drop Both Eyes QHS  . loratadine  10 mg Oral  Daily  . metoprolol tartrate  12.5 mg Oral BID  . pneumococcal 23 valent vaccine  0.5 mL Intramuscular Tomorrow-1000  . timolol  1 drop Both Eyes BID   Continuous Infusions:  PRN Meds: acetaminophen **OR** acetaminophen, albuterol, ALPRAZolam, HYDROcodone-acetaminophen, ondansetron **OR** ondansetron (ZOFRAN) IV  Allergies:    Allergies  Allergen Reactions  . Codeine Nausea Only    Social History:   Social History   Socioeconomic History  . Marital status: Married    Spouse name: Not on file  . Number of children: Not on file  . Years of education: Not on file  . Highest  education level: Not on file  Occupational History  . Not on file  Social Needs  . Financial resource strain: Not on file  . Food insecurity:    Worry: Not on file    Inability: Not on file  . Transportation needs:    Medical: Not on file    Non-medical: Not on file  Tobacco Use  . Smoking status: Never Smoker  . Smokeless tobacco: Never Used  Substance and Sexual Activity  . Alcohol use: Never    Frequency: Never  . Drug use: Never  . Sexual activity: Yes  Lifestyle  . Physical activity:    Days per week: Not on file    Minutes per session: Not on file  . Stress: Not on file  Relationships  . Social connections:    Talks on phone: Not on file    Gets together: Not on file    Attends religious service: Not on file    Active member of club or organization: Not on file    Attends meetings of clubs or organizations: Not on file    Relationship status: Not on file  . Intimate partner violence:    Fear of current or ex partner: Not on file    Emotionally abused: Not on file    Physically abused: Not on file    Forced sexual activity: Not on file  Other Topics Concern  . Not on file  Social History Narrative  . Not on file    Family History:    Family History  Problem Relation Age of Onset  . CAD Father   . Hypertension Father   . Liver disease Mother   . Breast cancer Sister   . Hypertension Brother      ROS:  Please see the history of present illness.  Review of Systems  Constitutional: Negative for chills, diaphoresis and fever.  HENT: Negative for ear pain and hearing loss.   Eyes: Positive for blurred vision. Negative for pain.  Respiratory: Positive for shortness of breath. Negative for sputum production.   Cardiovascular: Positive for chest pain. Negative for palpitations, orthopnea, claudication, leg swelling and PND.  Gastrointestinal: Negative for abdominal pain, nausea and vomiting.  Genitourinary: Negative for dysuria and hematuria.    Musculoskeletal: Negative for falls and myalgias.  Neurological: Negative for sensory change, focal weakness and loss of consciousness.  Endo/Heme/Allergies: Does not bruise/bleed easily.   All other ROS reviewed and negative.     Physical Exam/Data:   Vitals:   03/05/18 1815 03/05/18 1853 03/05/18 2142 03/05/18 2327  BP: (!) 162/96 (!) 169/92 (!) 150/92 139/67  Pulse: 78 74 72 60  Resp: 20 (!) 22  18  Temp:  98.3 F (36.8 C)  98.2 F (36.8 C)  TempSrc:  Oral  Oral  SpO2: 94% 97%  94%    Intake/Output Summary (Last 24 hours)  at 03/06/2018 1006 Last data filed at 03/06/2018 0757 Gross per 24 hour  Intake 194.17 ml  Output 525 ml  Net -330.83 ml   There were no vitals filed for this visit. There is no height or weight on file to calculate BMI.  General:  Well nourished, well developed, in no acute distress HEENT: normal. Dysconjugate gaze. Lymph: no adenopathy Neck: no JVD Endocrine:  No thryomegaly Vascular: No carotid bruits; RA pulses 2+ bilaterally Cardiac:  normal S1, S2; RRR; no murmur Lungs:  clear to auscultation bilaterally, no wheezing, rhonchi or rales  Abd: soft, nontender, no hepatomegaly  Ext: no edema Musculoskeletal:  No deformities, BUE and BLE strength normal and equal Skin: warm and dry  Neuro:  CNs 2-12 intact, no focal abnormalities noted Psych:  Normal affect   EKG:  The EKG was personally reviewed and demonstrates:  Sinus bradycardia with flat or inverted T waves throughout Telemetry:  Telemetry was personally reviewed and demonstrates:  Sinus rhythm/sinus bradycardia  Relevant CV Studies: Echo pending  Laboratory Data:  Chemistry Recent Labs  Lab 03/05/18 1358 03/06/18 0450  NA 142 140  K 5.0 4.2  CL 104 102  CO2 27 28  GLUCOSE 115* 101*  BUN 10 10  CREATININE 0.96 0.87  CALCIUM 10.1 9.1  GFRNONAA >60 >60  GFRAA >60 >60  ANIONGAP 11 10    Recent Labs  Lab 03/05/18 1418 03/06/18 0450  PROT 8.0 6.5  ALBUMIN 4.9 3.9  AST 36  31  ALT 58* 46*  ALKPHOS 55 45  BILITOT 0.8 0.9   Hematology Recent Labs  Lab 03/05/18 1358 03/06/18 0450  WBC 6.3 6.9  RBC 5.49 5.03  HGB 17.6* 15.9  HCT 49.5 45.9  MCV 90.2 91.3  MCH 32.1 31.6  MCHC 35.6 34.6  RDW 12.4 12.6  PLT 268 209   Cardiac Enzymes Recent Labs  Lab 03/05/18 1609 03/05/18 2245 03/06/18 0450  TROPONINI <0.03 <0.03 <0.03    Recent Labs  Lab 03/05/18 1436  TROPIPOC 0.00    BNPNo results for input(s): BNP, PROBNP in the last 168 hours.  DDimer No results for input(s): DDIMER in the last 168 hours.  Radiology/Studies:  Dg Chest Portable 1 View  Result Date: 03/05/2018 CLINICAL DATA:  Central chest tightness EXAM: PORTABLE CHEST 1 VIEW COMPARISON:  None. FINDINGS: Elevation of the left hemidiaphragm. Bibasilar atelectasis. Heart is normal size. No effusions or acute bony abnormality. IMPRESSION: Elevated left hemidiaphragm.  Bibasilar atelectasis. Electronically Signed   By: Rolm Baptise M.D.   On: 03/05/2018 14:40    Assessment and Plan:   1. Chest pain: troponins negative. Does have mildly abnormal ECG but no ST elevations. No prior in the system for comparison. His pain is nonexertional, but he does have a family history in his father of early CAD. The 10-year ASCVD risk score Mikey Bussing DC Brooke Bonito., et al., 2013) is: 9.5%   Values used to calculate the score:     Age: 31 years     Sex: Male     Is Non-Hispanic African American: No     Diabetic: No     Tobacco smoker: No     Systolic Blood Pressure: 267 mmHg     Is BP treated: No     HDL Cholesterol: 40 mg/dL     Total Cholesterol: 206 mg/dL  TSH nl A1c not available He is in the moderate risk category, but I think his family history adds to his risk.  We discussed options  for workup, including stress test and coronary CT angiography. Risks and benefits of both tests discussed. Given that he exercises at home, I think a treadmill test would be a good option for him. His sight is somewhat limited,  but he notes that he can see enough to exercise without issue. He only received one dose of metoprolol last night, so I think he should be able to get to target heart rate. Given his baseline ECG abnormalities, adding the imaging component will help [place] to better risk stratify him.  We will plan for treadmill nuclear study tomorrow. NPO after midnight, no beta blocker. Will need to speak to [facility] for transport once he is scheduled. Based on results of imaging, would recommend discussing changing to statin. If abnormal test, given his family history, would pursue cardiac cath.  His blood pressures have been improved since admission. If they remain elevated, would recommend chlorthalidone, ACEI, or amlodipine as first line.   We will follow.  For questions or updates, please contact [place] Please consult [email or web address] for contact info under   Signed, Buford Dresser, MD  03/06/2018 10:06 AM

## 2018-03-06 NOTE — Progress Notes (Signed)
  Echocardiogram 2D Echocardiogram has been performed.  Bobbye Charleston 03/06/2018, 10:41 AM

## 2018-03-06 NOTE — Progress Notes (Signed)
PROGRESS NOTE    John Bell   CZY:[id number]  DOB: 29-Dec-1960  DOA: 03/05/2018 PCP: Dineen Kid, MD   Brief Narrative:  John Bell is a 57 y.o. male with medical history significant of Dysconjugate gaze optic neuritis family history of coronary artery disease who presents for chest pain.   Subjective: No complaints today.  ROS: no complaints of nausea, vomiting, constipation diarrhea, cough, dyspnea or dysuria. No other complaints.   Assessment & Plan:   Active Problems:   Chest pain - cardiac enzymes negative - appreciate cardiology eval- plans for stress test tomorrow    Elevated BP without diagnosis of hypertension - cont to follow for today    Anxiety - PRN Xanax    Blind   DVT prophylaxis: Lovenox Code Status: Full code Family Communication:  Disposition Plan: stress test tomorrow Consultants:   Cardiology  Procedures:    Antimicrobials:  Anti-infectives (From admission, onward)   None       Objective: Vitals:   03/05/18 2327 03/06/18 1135 03/06/18 1207 03/06/18 1359  BP: 139/67 (!) 171/90 (!) 147/95 (!) 161/92  Pulse: 60 74 72 63  Resp: 18 18 16 20   Temp: 98.2 F (36.8 C) 98.4 F (36.9 C) 98.4 F (36.9 C) 98.5 F (36.9 C)  TempSrc: Oral Oral Oral Oral  SpO2: 94% 96% 95% 96%    Intake/Output Summary (Last 24 hours) at 03/06/2018 1400 Last data filed at 03/06/2018 1210 Gross per 24 hour  Intake 794.17 ml  Output 525 ml  Net 269.17 ml   There were no vitals filed for this visit.  Examination: General exam: Appears comfortable  HEENT: PERRLA, oral mucosa moist, no sclera icterus or thrush Respiratory system: Clear to auscultation. Respiratory effort normal. Cardiovascular system: S1 & S2 heard, RRR.   Gastrointestinal system: Abdomen soft, non-tender, nondistended. Normal bowel sound. No organomegaly Central nervous system: Alert and oriented. No focal neurological deficits. Extremities: No cyanosis, clubbing or edema Skin: No  rashes or ulcers Psychiatry:  Mood & affect appropriate.     Data Reviewed: I have personally reviewed following labs and imaging studies  CBC: Recent Labs  Lab 03/05/18 1358 03/06/18 0450  WBC 6.3 6.9  HGB 17.6* 15.9  HCT 49.5 45.9  MCV 90.2 91.3  PLT 268 093   Basic Metabolic Panel: Recent Labs  Lab 03/05/18 1358 03/06/18 0450  NA 142 140  K 5.0 4.2  CL 104 102  CO2 27 28  GLUCOSE 115* 101*  BUN 10 10  CREATININE 0.96 0.87  CALCIUM 10.1 9.1  MG  --  2.1  PHOS  --  3.4   GFR: CrCl cannot be calculated (Unknown ideal weight.). Liver Function Tests: Recent Labs  Lab 03/05/18 1418 03/06/18 0450  AST 36 31  ALT 58* 46*  ALKPHOS 55 45  BILITOT 0.8 0.9  PROT 8.0 6.5  ALBUMIN 4.9 3.9   Recent Labs  Lab 03/05/18 1418  LIPASE 35   No results for input(s): AMMONIA in the last 168 hours. Coagulation Profile: No results for input(s): INR, PROTIME in the last 168 hours. Cardiac Enzymes: Recent Labs  Lab 03/05/18 1609 03/05/18 2245 03/06/18 0450  TROPONINI <0.03 <0.03 <0.03   BNP (last 3 results) No results for input(s): PROBNP in the last 8760 hours. HbA1C: No results for input(s): HGBA1C in the last 72 hours. CBG: Recent Labs  Lab 03/05/18 1428  GLUCAP 111*   Lipid Profile: Recent Labs    03/06/18 0450  CHOL 206*  HDL 40*  LDLCALC 140*  TRIG 130  CHOLHDL 5.2   Thyroid Function Tests: Recent Labs    03/06/18 0450  TSH 1.691   Anemia Panel: No results for input(s): VITAMINB12, FOLATE, FERRITIN, TIBC, IRON, RETICCTPCT in the last 72 hours. Urine analysis:    Component Value Date/Time   COLORURINE YELLOW 03/05/2018 1622   APPEARANCEUR CLEAR 03/05/2018 1622   LABSPEC 1.010 03/05/2018 1622   PHURINE 8.0 03/05/2018 1622   GLUCOSEU NEGATIVE 03/05/2018 1622   HGBUR NEGATIVE 03/05/2018 1622   BILIRUBINUR NEGATIVE 03/05/2018 1622   KETONESUR NEGATIVE 03/05/2018 1622   PROTEINUR NEGATIVE 03/05/2018 1622   NITRITE NEGATIVE 03/05/2018  1622   LEUKOCYTESUR NEGATIVE 03/05/2018 1622   Sepsis Labs: @LABRCNTIP (procalcitonin:4,lacticidven:4) )No results found for this or any previous visit (from the past 240 hour(s)).       Radiology Studies: Dg Chest Portable 1 View  Result Date: 03/05/2018 CLINICAL DATA:  Central chest tightness EXAM: PORTABLE CHEST 1 VIEW COMPARISON:  None. FINDINGS: Elevation of the left hemidiaphragm. Bibasilar atelectasis. Heart is normal size. No effusions or acute bony abnormality. IMPRESSION: Elevated left hemidiaphragm.  Bibasilar atelectasis. Electronically Signed   By: Rolm Baptise M.D.   On: 03/05/2018 14:40      Scheduled Meds: . brimonidine  1 drop Both Eyes TID  . dorzolamide  1 drop Both Eyes BID  . enoxaparin (LOVENOX) injection  40 mg Subcutaneous Q24H  . gi cocktail  30 mL Oral Once  . guaiFENesin  600 mg Oral BID  . Influenza vac split quadrivalent PF  0.5 mL Intramuscular Tomorrow-1000  . latanoprost  1 drop Both Eyes QHS  . loratadine  10 mg Oral Daily  . timolol  1 drop Both Eyes BID   Continuous Infusions:   LOS: 0 days    Time spent in minutes: 35    Debbe Odea, MD [facility] Pager: [email or web address] Password [email or web address] 03/06/2018, 2:00 PM

## 2018-03-07 ENCOUNTER — Encounter: Payer: Self-pay | Admitting: Physician Assistant

## 2018-03-07 ENCOUNTER — Ambulatory Visit (HOSPITAL_COMMUNITY)
Admission: RE | Admit: 2018-03-07 | Discharge: 2018-03-07 | Disposition: A | Discharge status: Home / Self Care | Payer: Medicare HMO | Source: Ambulatory Visit | Attending: Student | Admitting: Student

## 2018-03-07 DIAGNOSIS — R9439 Abnormal result of other cardiovascular function study: Secondary | ICD-10-CM | POA: Diagnosis not present

## 2018-03-07 DIAGNOSIS — R030 Elevated blood-pressure reading, without diagnosis of hypertension: Secondary | ICD-10-CM | POA: Diagnosis not present

## 2018-03-07 DIAGNOSIS — R69 Illness, unspecified: Secondary | ICD-10-CM | POA: Diagnosis not present

## 2018-03-07 DIAGNOSIS — R0789 Other chest pain: Secondary | ICD-10-CM | POA: Diagnosis not present

## 2018-03-07 DIAGNOSIS — I2589 Other forms of chronic ischemic heart disease: Secondary | ICD-10-CM | POA: Diagnosis not present

## 2018-03-07 DIAGNOSIS — R079 Chest pain, unspecified: Secondary | ICD-10-CM | POA: Diagnosis not present

## 2018-03-07 DIAGNOSIS — H5450 Low vision, one eye, unspecified eye: Secondary | ICD-10-CM | POA: Diagnosis not present

## 2018-03-07 DIAGNOSIS — H5440 Blindness, one eye, unspecified eye: Secondary | ICD-10-CM | POA: Diagnosis not present

## 2018-03-07 DIAGNOSIS — F419 Anxiety disorder, unspecified: Secondary | ICD-10-CM | POA: Diagnosis not present

## 2018-03-07 LAB — NM MYOCAR MULTI W/SPECT W/WALL MOTION / EF
CSEPEDS: 1 s
CSEPEW: 7 METS
CSEPHR: 87 %
CSEPPHR: 142 {beats}/min
Exercise duration (min): 6 min
MPHR: 163 {beats}/min
RPE: 15
Rest HR: 74 {beats}/min

## 2018-03-07 MED ORDER — TECHNETIUM TC 99M TETROFOSMIN IV KIT
30.0000 | PACK | Freq: Once | INTRAVENOUS | Status: AC | PRN
  Administered 2018-03-07: 30 via INTRAVENOUS

## 2018-03-07 MED ORDER — AMLODIPINE BESYLATE 5 MG PO TABS: 5.0000 mg | ORAL_TABLET | Freq: Every day | ORAL | 11 refills | Status: DC

## 2018-03-07 MED ORDER — ALPRAZOLAM 0.25 MG PO TABS: 0.2500 mg | ORAL_TABLET | Freq: Every evening | ORAL | 0 refills | Status: DC | PRN

## 2018-03-07 MED ORDER — TECHNETIUM TC 99M TETROFOSMIN IV KIT
10.0000 | PACK | Freq: Once | INTRAVENOUS | Status: AC | PRN
  Administered 2018-03-07: 10 via INTRAVENOUS

## 2018-03-07 MED ORDER — ATORVASTATIN CALCIUM 20 MG PO TABS: 20.0000 mg | ORAL_TABLET | Freq: Every day | ORAL | 0 refills | Status: DC

## 2018-03-07 MED ORDER — ASPIRIN EC 81 MG PO TBEC: 81.0000 mg | DELAYED_RELEASE_TABLET | Freq: Every day | ORAL | 2 refills | Status: DC

## 2018-03-07 NOTE — Progress Notes (Signed)
I have reviewed nuclear stress test  Pt exercised for 6 min No chest pain Pt had an extreme hypertensive response with   SBP of 240    EKG had nondiagnostic T wave inversion and no significant ST depression, mainly in recovery  Myovue scan showed a small region of mild distal septal and apical ischemia.    Overall LVEF appears normal  Impression:   This represents a low risk test   May represent some ischemia due to high BP alone  REcomm: Continue ecASA   Treat BP    HR has been slow at times   I would try amlodipine 5 mg    WIll arrange for outpatient f/u.  Dorris Carnes

## 2018-03-07 NOTE — Progress Notes (Signed)
Rec'd request from Dr. Harrington Challenger to schedule f/u within several weeks. Pt has already been d/c'd. I have sent a message to our office's scheduling team requesting a follow-up appointment, and our office will call the patient with this information.  Itza Maniaci PA-C

## 2018-03-07 NOTE — Progress Notes (Addendum)
Progress Note  Patient Name: John Bell Date of Encounter: 03/07/2018  Primary Cardiologist:  [facility] (Dr. Buford Dresser)  Subjective   Feeling well. No chest pain, sob or palpitations. Seen in nuc med.   Inpatient Medications    Scheduled Meds: . brimonidine  1 drop Both Eyes TID  . dorzolamide  1 drop Both Eyes BID  . enoxaparin (LOVENOX) injection  40 mg Subcutaneous Q24H  . gi cocktail  30 mL Oral Once  . guaiFENesin  600 mg Oral BID  . latanoprost  1 drop Both Eyes QHS  . loratadine  10 mg Oral Daily  . timolol  1 drop Both Eyes BID   Continuous Infusions:  PRN Meds: acetaminophen **OR** acetaminophen, albuterol, ALPRAZolam, HYDROcodone-acetaminophen, ondansetron **OR** ondansetron (ZOFRAN) IV   Vital Signs    Vitals:   03/06/18 1409 03/06/18 1826 03/07/18 0558 03/07/18 0600  BP:  (!) 157/87 125/76   Pulse:  66 (!) 59   Resp:  17 18   Temp:  98.4 F (36.9 C) (!) 97.5 F (36.4 C)   TempSrc:  Oral Oral   SpO2: 95% 96% 95%   Weight:    105.9 kg  Height:    5\' 10"  (1.778 m)    Intake/Output Summary (Last 24 hours) at 03/07/2018 1050 Last data filed at 03/06/2018 1700 Gross per 24 hour  Intake 600 ml  Output 650 ml  Net -50 ml   Filed Weights   03/07/18 0600  Weight: 105.9 kg    Telemetry    Unable to review as seen in nuc med  ECG    Sr  Physical Exam   GEN: No acute distress.   Neck: No JVD Cardiac: RRR, no murmurs, rubs, or gallops.  Respiratory: Clear to auscultation bilaterally. GI: Soft, nontender, non-distended  MS: No edema; No deformity. Neuro:  Nonfocal  Psych: Normal affect   Labs    Chemistry Recent Labs  Lab 03/05/18 1358 03/05/18 1418 03/06/18 0450  NA 142  --  140  K 5.0  --  4.2  CL 104  --  102  CO2 27  --  28  GLUCOSE 115*  --  101*  BUN 10  --  10  CREATININE 0.96  --  0.87  CALCIUM 10.1  --  9.1  PROT  --  8.0 6.5  ALBUMIN  --  4.9 3.9  AST  --  36 31  ALT  --  58* 46*  ALKPHOS  --  55 45    BILITOT  --  0.8 0.9  GFRNONAA >60  --  >60  GFRAA >60  --  >60  ANIONGAP 11  --  10     Hematology Recent Labs  Lab 03/05/18 1358 03/06/18 0450  WBC 6.3 6.9  RBC 5.49 5.03  HGB 17.6* 15.9  HCT 49.5 45.9  MCV 90.2 91.3  MCH 32.1 31.6  MCHC 35.6 34.6  RDW 12.4 12.6  PLT 268 209    Cardiac Enzymes Recent Labs  Lab 03/05/18 1609 03/05/18 2245 03/06/18 0450  TROPONINI <0.03 <0.03 <0.03    Recent Labs  Lab 03/05/18 1436  TROPIPOC 0.00     BNPNo results for input(s): BNP, PROBNP in the last 168 hours.   DDimer No results for input(s): DDIMER in the last 168 hours.   Radiology    Dg Chest Portable 1 View  Result Date: 03/05/2018 CLINICAL DATA:  Central chest tightness EXAM: PORTABLE CHEST 1 VIEW COMPARISON:  None. FINDINGS: Elevation  of the left hemidiaphragm. Bibasilar atelectasis. Heart is normal size. No effusions or acute bony abnormality. IMPRESSION: Elevated left hemidiaphragm.  Bibasilar atelectasis. Electronically Signed   By: Rolm Baptise M.D.   On: 03/05/2018 14:40    Cardiac Studies   Pending nuc  Echo Study Conclusions  - Procedure narrative: Transthoracic echocardiography. Image   quality was suboptimal. The study was technically difficult, as a   result of poor acoustic windows, poor sound wave transmission,   and body habitus. Intravenous contrast (Definity) was   administered. - Left ventricle: Poorly visualized. The cavity size was normal.   Systolic function was vigorous. The estimated ejection fraction   was in the range of 65% to 70%. Wall motion was normal; there   were no regional wall motion abnormalities. Left ventricular   diastolic function parameters were normal.   Patient Profile     John Bell is a 57 y.o. male with a hx of vision loss, GERD who is seen for the evaluation of chest pain at the request of Dr. Wynelle Cleveland.  Assessment & Plan    1. Chest pain  - No recurrent pain. Troponin negative. Echo with normal LVEF.  Non specific TWI with hypertensive response during stress test. Pending final reading.     For questions or updates, please contact [place] Please consult [email or web address] for contact info under        Signed, Leanor Kail, PA  03/07/2018, 10:50 AM    Pt seen and examined   I agree with findings as noted by B Bhagat above    Currently without CP  Breathing is OK ON exam: Lungs are CTA  Cardiac RRR   No S3  No murmurs Ext are without edema   Hypertensive response during stress testing (lexiscan)   EKG wihtout diagnostic ST depression Await myovue imaging  [employer]

## 2018-03-07 NOTE — Discharge Summary (Signed)
Physician Discharge Summary  John Bell [id number] DOB: 1960/11/18 DOA: 03/05/2018  PCP: John Kid, MD  Admit date: 03/05/2018 Discharge date: 03/07/2018  Admitted From: home Disposition:  home   Recommendations for Outpatient Follow-up:  1. F/u BP  2. 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  Hospital Course:  Active Problems:   Chest pain - cardiac enzymes negative - appreciate cardiology eval - stress test today is low probability for CAD but SBP did climb up to 240 - 2 D ECHO noted below showed a normal EF - cardiology has recommended starting 5 mg Norvasc and a baby aspirin daily- they will f/u as outpt    Elevated BP without diagnosis of hypertension - as noted above  Hyperlipidemia - Cholesterol 206, LDL 140 - he has already been working on dieting and exercising - have prescribed Lipitor  Obesity Body mass index is 33.49 kg/m.  - he is working on losing weight through diet and exercise    Anxiety - PRN Xanax ordered for now- I have explained that this should only be used periodically and is not to be considered a long term treatment for anxiety  - we have discussed f/u with a therapist, psychologist and a psychiatrist as outpt to addressed the source of his anxiety and work on resolving this    Blind   Discharge Exam: Vitals:   03/06/18 1826 03/07/18 0558  BP: (!) 157/87 125/76  Pulse: 66 (!) 59  Resp: 17 18  Temp: 98.4 F (36.9 C) (!) 97.5 F (36.4 C)  SpO2: 96% 95%   Vitals:   03/06/18 1409  03/06/18 1826 03/07/18 0558 03/07/18 0600  BP:  (!) 157/87 125/76   Pulse:  66 (!) 59   Resp:  17 18   Temp:  98.4 F (36.9 C) (!) 97.5 F (36.4 C)   TempSrc:  Oral Oral   SpO2: 95% 96% 95%   Weight:    105.9 kg  Height:    5\' 10"  (1.778 m)    General: Pt is alert, awake, not in acute distress Cardiovascular: RRR, S1/S2 +, no rubs, no gallops Respiratory: CTA bilaterally, no wheezing, no rhonchi Abdominal: Soft, NT, ND, bowel sounds + Extremities: no edema, no cyanosis   Discharge Instructions  Discharge Instructions    Diet - low sodium heart healthy   Complete by:  As directed    Increase activity slowly   Complete by:  As directed      Allergies as of 03/07/2018      Reactions   Codeine Nausea Only      Medication List    TAKE these medications   ALPRAZolam 0.25 MG tablet Commonly known as:  XANAX Take 1 tablet (0.25 mg total) by mouth at bedtime as needed for anxiety.   atorvastatin 20 MG tablet Commonly known as:  LIPITOR Take 1 tablet (20 mg total) by mouth daily.   brimonidine 0.2 % ophthalmic solution Commonly known as:  ALPHAGAN Place 1 drop into both eyes 3 (three) times daily.  CLARITIN 10 MG Caps Generic drug:  Loratadine Take 10 mg by mouth daily.   dorzolamide 2 % ophthalmic solution Commonly known as:  TRUSOPT Place 1 drop into both eyes 2 (two) times daily.   latanoprost 0.005 % ophthalmic solution Commonly known as:  XALATAN Apply 1 drop to eye at bedtime.   OMEGA 3 PO Take 2 capsules by mouth 2 (two) times daily.   RED YEAST RICE PO Take 2 capsules by mouth daily.   timolol 0.25 % ophthalmic solution Commonly known as:  BETIMOL Place 1 drop into both eyes at bedtime.   timolol 0.5 % ophthalmic solution Commonly known as:  TIMOPTIC Place 1 drop into both eyes 2 (two) times daily.       Allergies  Allergen Reactions  . Codeine Nausea Only     Procedures/Studies: 2 D ECHO Procedure narrative: Transthoracic  echocardiography. Image   quality was suboptimal. The study was technically difficult, as a   result of poor acoustic windows, poor sound wave transmission,   and body habitus. Intravenous contrast (Definity) was   administered. - Left ventricle: Poorly visualized. The cavity size was normal.   Systolic function was vigorous. The estimated ejection fraction   was in the range of 65% to 70%. Wall motion was normal; there   were no regional wall motion abnormalities. Left ventricular   diastolic function parameters were normal.  Dg Chest Portable 1 View  Result Date: 03/05/2018 CLINICAL DATA:  Central chest tightness EXAM: PORTABLE CHEST 1 VIEW COMPARISON:  None. FINDINGS: Elevation of the left hemidiaphragm. Bibasilar atelectasis. Heart is normal size. No effusions or acute bony abnormality. IMPRESSION: Elevated left hemidiaphragm.  Bibasilar atelectasis. Electronically Signed   By: John Bell M.D.   On: 03/05/2018 14:40      The results of significant diagnostics from this hospitalization (including imaging, microbiology, ancillary and laboratory) are listed below for reference.     Microbiology: No results found for this or any previous visit (from the past 240 hour(s)).   Labs: BNP (last 3 results) No results for input(s): BNP in the last 8760 hours. Basic Metabolic Panel: Recent Labs  Lab 03/05/18 1358 03/06/18 0450  NA 142 140  K 5.0 4.2  CL 104 102  CO2 27 28  GLUCOSE 115* 101*  BUN 10 10  CREATININE 0.96 0.87  CALCIUM 10.1 9.1  MG  --  2.1  PHOS  --  3.4   Liver Function Tests: Recent Labs  Lab 03/05/18 1418 03/06/18 0450  AST 36 31  ALT 58* 46*  ALKPHOS 55 45  BILITOT 0.8 0.9  PROT 8.0 6.5  ALBUMIN 4.9 3.9   Recent Labs  Lab 03/05/18 1418  LIPASE 35   No results for input(s): AMMONIA in the last 168 hours. CBC: Recent Labs  Lab 03/05/18 1358 03/06/18 0450  WBC 6.3 6.9  HGB 17.6* 15.9  HCT 49.5 45.9  MCV 90.2 91.3  PLT 268 209   Cardiac  Enzymes: Recent Labs  Lab 03/05/18 1609 03/05/18 2245 03/06/18 0450  TROPONINI <0.03 <0.03 <0.03   BNP: Invalid input(s): POCBNP CBG: Recent Labs  Lab 03/05/18 1428  GLUCAP 111*   D-Dimer No results for input(s): DDIMER in the last 72 hours. Hgb A1c No results for input(s): HGBA1C in the last 72 hours. Lipid Profile Recent Labs    03/06/18 0450  CHOL 206*  HDL 40*  LDLCALC 140*  TRIG 130  CHOLHDL 5.2   Thyroid function studies Recent Labs  03/06/18 0450  TSH 1.691   Anemia work up No results for input(s): VITAMINB12, FOLATE, FERRITIN, TIBC, IRON, RETICCTPCT in the last 72 hours. Urinalysis    Component Value Date/Time   COLORURINE YELLOW 03/05/2018 1622   APPEARANCEUR CLEAR 03/05/2018 1622   LABSPEC 1.010 03/05/2018 1622   PHURINE 8.0 03/05/2018 1622   GLUCOSEU NEGATIVE 03/05/2018 1622   HGBUR NEGATIVE 03/05/2018 1622   BILIRUBINUR NEGATIVE 03/05/2018 1622   KETONESUR NEGATIVE 03/05/2018 1622   PROTEINUR NEGATIVE 03/05/2018 1622   NITRITE NEGATIVE 03/05/2018 1622   LEUKOCYTESUR NEGATIVE 03/05/2018 1622   Sepsis Labs Invalid input(s): PROCALCITONIN,  WBC,  LACTICIDVEN Microbiology No results found for this or any previous visit (from the past 240 hour(s)).   Time coordinating discharge in minutes: 60  SIGNED:   Debbe Odea, MD  [facility] 03/07/2018, 1:24 PM Pager   If 7PM-7AM, please contact night-coverage [email or web address] Password [email or web address]

## 2018-03-07 NOTE — Progress Notes (Signed)
Patient presented for Lexiscan. Tolerated procedure well. Pending final stress imaging result.  

## 2018-03-20 NOTE — Progress Notes (Signed)
Cardiology Office Note   Date:  03/21/2018   ID:  John Bell, DOB 06/15/1960, MRN [id number]  PCP:  Dineen Kid, MD  Cardiologist: Dr. Harrell Gave Chief Complaint  Patient presents with  . Hospitalization Follow-up  . Hypertension  . Hyperlipidemia     History of Present Illness: John Bell is a 57 y.o. male who presents for posthospitalization follow-up after being evaluated for chest pain on consultation.  He was ruled out for ACS, echocardiogram was normal.  The patient had nuclear medicine stress test which revealed hypertensive response.  Other history includes blindness, (related to disconjugate gaze optic neuritis), hypertension, GERD, hyperlipidemia, anxiety, and obesity.  Due to hypertension, the patient was started on amlodipine 5 mg daily, he was advised on weight loss and increased exercise, due to elevated LDL of 140 with a total cholesterol 206, the patient was started on Lipitor.  He is here for posthospitalization follow-up.  He comes with multiple questions. 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 He denies any side effects such as dizziness or LEE puffiness. He has bought a hone BP cuff which is reading higher than office appointments.   He is adamant about not taking statin. He states that he is worries about memory loss and senility on these medications. He is on Red Yeast Rice and Fish oil. He is exercising daily on an elliptical. He is struggling with low cholesterol diet.   Past Medical History:  Diagnosis Date  . Asthma     Past Surgical History:  Procedure Laterality Date  . EYE SURGERY     Legally Blind     Current Outpatient Medications  Medication Sig Dispense Refill  . ALPRAZolam (XANAX) 0.25 MG tablet Take 1 tablet (0.25 mg total) by mouth at bedtime as needed for anxiety. 20 tablet 0  . amLODipine (NORVASC) 5 MG tablet Take 1 tablet (5 mg total) by mouth daily. 90 tablet 3  . brimonidine (ALPHAGAN) 0.2 %  ophthalmic solution Place 1 drop into both eyes 3 (three) times daily.  12  . dorzolamide (TRUSOPT) 2 % ophthalmic solution Place 1 drop into both eyes 2 (two) times daily.    Marland Kitchen latanoprost (XALATAN) 0.005 % ophthalmic solution Apply 1 drop to eye at bedtime.    . Loratadine (CLARITIN) 10 MG CAPS Take 10 mg by mouth daily.    . Omega-3 Fatty Acids (OMEGA 3 PO) Take 2 capsules by mouth 2 (two) times daily.    . Red Yeast Rice Extract (RED YEAST RICE PO) Take 2 capsules by mouth daily.    . timolol (BETIMOL) 0.25 % ophthalmic solution Place 1 drop into both eyes at bedtime.    . timolol (TIMOPTIC) 0.5 % ophthalmic solution Place 1 drop into both eyes 2 (two) times daily.     No current facility-administered medications for this visit.     Allergies:   Codeine    Social History:  The patient  reports that he has never smoked. He has never used smokeless tobacco. He reports that he does not drink alcohol or use drugs.   Family History:  The patient's family history includes Breast cancer in his sister; CAD in his father; Hypertension in his brother and father; Liver disease in his mother.    ROS: All other systems are reviewed and negative. Unless otherwise mentioned in H&P    PHYSICAL EXAM: VS:  BP 136/84   Pulse 81   Ht 5\' 10"  (1.778 m)  Wt 238 lb 6.4 oz (108.1 kg)   SpO2 95%   BMI 34.21 kg/m  , BMI Body mass index is 34.21 kg/m. GEN: Well nourished, well developed, in no acute distress HEENT: normal Neck: no JVD, carotid bruits, or masses Cardiac: RRR; no murmurs, rubs, or gallops,no edema  Respiratory:  Clear to auscultation bilaterally, normal work of breathing GI: soft, nontender, nondistended, + BS MS: no deformity or atrophy Skin: warm and dry, no rash Neuro:  Strength and sensation are intact Psych: euthymic mood, full affect   EKG: Not completed this office visit.   Recent Labs: 03/06/2018: ALT 46; BUN 10; Creatinine, Ser 0.87; Hemoglobin 15.9; Magnesium 2.1;  Platelets 209; Potassium 4.2; Sodium 140; TSH 1.691    Lipid Panel    Component Value Date/Time   CHOL 206 (H) 03/06/2018 0450   TRIG 130 03/06/2018 0450   HDL 40 (L) 03/06/2018 0450   CHOLHDL 5.2 03/06/2018 0450   VLDL 26 03/06/2018 0450   LDLCALC 140 (H) 03/06/2018 0450      Wt Readings from Last 3 Encounters:  03/21/18 238 lb 6.4 oz (108.1 kg)  03/19/18 233 lb 6.4 oz (105.9 kg)      Other studies Reviewed: NM Stress Test 03/19/2018  Blood pressure demonstrated a hypertensive response to exercise.  Downsloping ST segment depression ST segment depression of 1 mm was noted during stress in the V5 and V6 leads.  Findings consistent with ischemia.  This is a low risk study.  The left ventricular ejection fraction is mildly decreased (45-54%).   SDS 5 with mild ischemia in the true apex as well as mid and apical septum Note there is some soft tissue attenuation In this are on RAW images. Baseline ECG abnormal but concern for 1 mm ST depression in inferior and anterolateral leads With T wave inversions V45 with stress and recovery EF estimated at 50% but looks normal   Echocardiogram 03/06/2018 Procedure narrative: Transthoracic echocardiography. Image   quality was suboptimal. The study was technically difficult, as a   result of poor acoustic windows, poor sound wave transmission,   and body habitus. Intravenous contrast (Definity) was   administered. - Left ventricle: Poorly visualized. The cavity size was normal.   Systolic function was vigorous. The estimated ejection fraction   was in the range of 65% to 70%. Wall motion was normal; there   were no regional wall motion abnormalities. Left ventricular   diastolic function parameters were normal.  ASSESSMENT AND PLAN:  1. Chest Pain: No evidence of ischemia on stress test completed during hospitalization. He was found to have hypertensive response. He was placed on amlodipine and is tolerating this.    2.  Hypertension: Multiple questions are answered. He does not wish to be on medications. He is working on exercise and weight loss so that he can reduce his need. For now he is to continue amlodipine.   3. Hypercholesterolemia: He refuses statin therapy. I have gone over his most recent labs which include his LDL at 140. I have recommended statin therapy. He absolutely will not accept this type of medication. He wants to continue Red Yeast Rice and Fish Oil along with dietary restriction. He is aware that it is against our recommendations. He will follow up with his PCP in December and discuss this with him again. LDL goal is < 70.    Current medicines are reviewed at length with the patient today.    Labs/ tests ordered today include: None  Phill Myron. [place] Pugh, ANP, AACC   03/21/2018 [address] Office [phone] Fax [phone]

## 2018-03-21 ENCOUNTER — Encounter: Payer: Self-pay | Admitting: Adult Health

## 2018-03-21 ENCOUNTER — Ambulatory Visit: Payer: Medicare HMO | Admitting: Adult Health

## 2018-03-21 VITALS — BP 136/84 | HR 81 | Ht 70.0 in | Wt 238.4 lb

## 2018-03-21 DIAGNOSIS — I10 Essential (primary) hypertension: Secondary | ICD-10-CM | POA: Diagnosis not present

## 2018-03-21 DIAGNOSIS — E7800 Pure hypercholesterolemia, unspecified: Secondary | ICD-10-CM

## 2018-03-21 MED ORDER — AMLODIPINE BESYLATE 5 MG PO TABS: 5.0000 mg | ORAL_TABLET | Freq: Every day | ORAL | 3 refills | Status: DC

## 2018-03-21 NOTE — Patient Instructions (Signed)
Follow-Up: You will need a follow up appointment in [place].  You may see  DR Harrell Gave, -or- one of the following Advanced Practice Providers on your designated Care Team:   . Rosaria Ferries, PA-C . Jory Sims, DNP, ANP  Medication Instructions:  NO CHANGES- Your physician recommends that you continue on your current medications as directed. Please refer to the Current Medication list given to you today.  If you need a refill on your cardiac medications before your next appointment, please call your pharmacy.  Labwork: If you have labs (blood work) drawn today and your tests are completely normal, you will receive your results ONLY by: . MyChart Message (if you have MyChart) -OR- . A paper copy in the mail  At [facility], you and your health needs are our priority.  As part of our continuing mission to provide you with exceptional heart care, we have created designated Provider Care Teams.  These Care Teams include your primary Cardiologist (physician) and Advanced Practice Providers (APPs -  Physician Assistants and Nurse Practitioners) who all work together to provide you with the care you need, when you need it.  Thank you for choosing [facility]!!

## 2018-07-07 ENCOUNTER — Encounter: Payer: Self-pay | Admitting: Cardiology

## 2018-07-07 ENCOUNTER — Ambulatory Visit: Payer: Medicare HMO | Admitting: Cardiology

## 2018-07-07 VITALS — BP 162/80 | HR 84 | Ht 70.0 in | Wt 240.2 lb

## 2018-07-07 DIAGNOSIS — E6609 Other obesity due to excess calories: Secondary | ICD-10-CM | POA: Diagnosis not present

## 2018-07-07 DIAGNOSIS — Z7189 Other specified counseling: Secondary | ICD-10-CM | POA: Diagnosis not present

## 2018-07-07 DIAGNOSIS — I10 Essential (primary) hypertension: Secondary | ICD-10-CM

## 2018-07-07 DIAGNOSIS — Z6834 Body mass index (BMI) 34.0-34.9, adult: Secondary | ICD-10-CM

## 2018-07-07 DIAGNOSIS — E785 Hyperlipidemia, unspecified: Secondary | ICD-10-CM | POA: Diagnosis not present

## 2018-07-07 MED ORDER — AMLODIPINE BESYLATE 10 MG PO TABS: 10.0000 mg | ORAL_TABLET | Freq: Every day | ORAL | 3 refills | Status: DC

## 2018-07-07 NOTE — Progress Notes (Signed)
Cardiology Office Note:    Date:  07/07/2018   ID:  John Bell, DOB [birth year], MRN [id number]  PCP:  Dineen Kid, MD  Cardiologist:  Buford Dresser, MD PhD  Referring MD: Dineen Kid, MD   CC: follow up  History of Present Illness:    John Bell is a 58 y.o. male with a hx of hypertension, hyperlipidemia, legal blindness who is seen for follow up of chest pain. I met him initially in the hospital for new consultation on 03/06/18. He had a nuclear stress test at that time. He followed up with Jory Sims on 03/21/18.  Sees Jamie at [facility] for fish oil and red yeast rice. Not interested in statin unless additional information suggests a need for it. Takes BP at home (until machine stopped working), has high variability. Has been between 130-200 SBP, notes that if he sits for a minute, it gets better. Works out every morning, uses elliptical and does calisthenics. Watches diet but is very frustrated that he can't lose weight. He has many questions regarding pathophysiology of cholesterol and medications.  Denies chest pain, shortness of breath at rest or with normal exertion. No PND, orthopnea, LE edema or unexpected weight gain. No syncope or palpitations.   Past Medical History:  Diagnosis Date  . Asthma     Past Surgical History:  Procedure Laterality Date  . EYE SURGERY     Legally Blind    Current Medications: Current Outpatient Medications on File Prior to Visit  Medication Sig  . brimonidine (ALPHAGAN) 0.2 % ophthalmic solution Place 1 drop into both eyes daily.   . dorzolamide (TRUSOPT) 2 % ophthalmic solution Place 1 drop into both eyes 2 (two) times daily.  Marland Kitchen latanoprost (XALATAN) 0.005 % ophthalmic solution Apply 1 drop to eye at bedtime.  . Loratadine (CLARITIN) 10 MG CAPS Take 10 mg by mouth daily.  . Omega-3 Fatty Acids (OMEGA 3 PO) Take 2 capsules by mouth 2 (two) times daily.  . Red Yeast Rice Extract (RED YEAST RICE PO) Take 2 capsules  by mouth daily.  . timolol (BETIMOL) 0.25 % ophthalmic solution Place 1 drop into both eyes at bedtime.  . timolol (TIMOPTIC) 0.5 % ophthalmic solution Place 1 drop into both eyes 2 (two) times daily.   No current facility-administered medications on file prior to visit.      Allergies:   Codeine   Social History   Socioeconomic History  . Marital status: Married    Spouse name: Not on file  . Number of children: Not on file  . Years of education: Not on file  . Highest education level: Not on file  Occupational History  . Not on file  Social Needs  . Financial resource strain: Not on file  . Food insecurity:    Worry: Not on file    Inability: Not on file  . Transportation needs:    Medical: Not on file    Non-medical: Not on file  Tobacco Use  . Smoking status: Never Smoker  . Smokeless tobacco: Never Used  Substance and Sexual Activity  . Alcohol use: Never    Frequency: Never  . Drug use: Never  . Sexual activity: Yes  Lifestyle  . Physical activity:    Days per week: Not on file    Minutes per session: Not on file  . Stress: Not on file  Relationships  . Social connections:    Talks on phone: Not on file    Gets together:  Not on file    Attends religious service: Not on file    Active member of club or organization: Not on file    Attends meetings of clubs or organizations: Not on file    Relationship status: Not on file  Other Topics Concern  . Not on file  Social History Narrative  . Not on file     Family History: The patient's family history includes Breast cancer in his sister; CAD in his father; Hypertension in his brother and father; Liver disease in his mother.  ROS:   Please see the history of present illness.  Additional pertinent ROS:  Constitutional: Negative for chills, fever, night sweats, unintentional weight loss  HENT: Negative for ear pain and hearing loss.   Eyes: Negative for loss of vision and eye pain.  Respiratory: Negative  for cough, sputum, shortness of breath, wheezing.   Cardiovascular: See HPI. Gastrointestinal: Negative for abdominal pain, melena, and hematochezia.  Genitourinary: Negative for dysuria and hematuria.  Musculoskeletal: Negative for falls and myalgias.  Skin: Negative for itching and rash.  Neurological: Negative for focal weakness, focal sensory changes and loss of consciousness.  Endo/Heme/Allergies: Does not bruise/bleed easily.    EKGs/Labs/Other Studies Reviewed:    The following studies were reviewed today: NM Stress Test 03/07/2018  Blood pressure demonstrated a hypertensive response to exercise.  Downsloping ST segment depression ST segment depression of 1 mm was noted during stress in the V5 and V6 leads.  Findings consistent with ischemia.  This is a low risk study.  The left ventricular ejection fraction is mildly decreased (45-54%).  SDS 5 with mild ischemia in the true apex as well as mid and apical septum Note there is some soft tissue attenuation In this are on RAW images. Baseline ECG abnormal but concern for 1 mm ST depression in inferior and anterolateral leads With T wave inversions V45 with stress and recovery EF estimated at 50% but looks normal   Echocardiogram 03/06/2018 Procedure narrative: Transthoracic echocardiography. Image quality was suboptimal. The study was technically difficult, as a result of poor acoustic windows, poor sound wave transmission, and body habitus. Intravenous contrast (Definity) was administered. - Left ventricle: Poorly visualized. The cavity size was normal. Systolic function was vigorous. The estimated ejection fraction was in the range of 65% to 70%. Wall motion was normal; there were no regional wall motion abnormalities. Left ventricular diastolic function parameters were normal.  EKG:  EKG is personally reviewed.  The ekg ordered today demonstrates NSR with unchanged precordial TWI.  Recent  Labs: 03/06/2018: ALT 46; BUN 10; Creatinine, Ser 0.87; Hemoglobin 15.9; Magnesium 2.1; Platelets 209; Potassium 4.2; Sodium 140; TSH 1.691  Recent Lipid Panel    Component Value Date/Time   CHOL 206 (H) 03/06/2018 0450   TRIG 130 03/06/2018 0450   HDL 40 (L) 03/06/2018 0450   CHOLHDL 5.2 03/06/2018 0450   VLDL 26 03/06/2018 0450   LDLCALC 140 (H) 03/06/2018 0450    Physical Exam:    VS:  BP (!) 162/80   Pulse 84   Ht 5' 10"  (1.778 m)   Wt 240 lb 3.2 oz (109 kg)   BMI 34.47 kg/m     Wt Readings from Last 3 Encounters:  07/07/18 240 lb 3.2 oz (109 kg)  03/21/18 238 lb 6.4 oz (108.1 kg)  03/07/18 233 lb 6.4 oz (105.9 kg)     GEN: Well nourished, well developed in no acute distress HEENT: Normal NECK: No JVD; No carotid bruits LYMPHATICS:  No lymphadenopathy CARDIAC: regular rhythm, normal S1 and S2, no murmurs, rubs, gallops. Radial and DP pulses 2+ bilaterally. RESPIRATORY:  Clear to auscultation without rales, wheezing or rhonchi  ABDOMEN: Soft, non-tender, non-distended MUSCULOSKELETAL:  No edema; No deformity  SKIN: Warm and dry NEUROLOGIC:  Alert and oriented x 3 PSYCHIATRIC:  Normal affect   ASSESSMENT:    1. Essential hypertension   2. Hyperlipidemia, unspecified hyperlipidemia type   3. Class 1 obesity due to excess calories without serious comorbidity with body mass index (BMI) of 34.0 to 34.9 in adult   4. Counseling on health promotion and disease prevention   5. Cardiac risk counseling    PLAN:    1. Hypertension: above goal today. Will increase amlodipine to 10 mg daily. Discussed possible LE edema 2. Hyperlipidemia: does not want to go on statin. Reviewed his numbers. He thinks an elevated calcium score would persuade him to consider a statin. Ordered today 3. Obesity: frustrated by lack of weight loss, encouraged continued diet and exercise 4. CV risk and prevention counseling: -recommend heart healthy/Mediterranean diet, with whole grains, fruits,  vegetable, fish, lean meats, nuts, and olive oil. Limit salt. -recommend moderate walking, 3-5 times/week for 30-50 minutes each session. Aim for at least 150 minutes.week. Goal should be pace of 3 miles/hours, or walking 1.5 miles in 30 minutes -recommend avoidance of tobacco products. Avoid excess alcohol. -ASCVD risk score: The 10-year ASCVD risk score Mikey Bussing DC Brooke Bonito., et al., 2013) is: 15.4%   Values used to calculate the score:     Age: 76 years     Sex: Male     Is Non-Hispanic African American: No     Diabetic: No     Tobacco smoker: No     Systolic Blood Pressure: 883 mmHg     Is BP treated: Yes     HDL Cholesterol: 40 mg/dL     Total Cholesterol: 206 mg/dL   Plan for follow up: 2 mos to monitor BP, discuss results of calcium score TIME SPENT WITH PATIENT: >25 minutes of direct patient care. More than 50% of that time was spent on coordination of care and counseling regarding CV risk management.  Buford Dresser, MD, PhD [place]  [facility]   Medication Adjustments/Labs and Tests Ordered: Current medicines are reviewed at length with the patient today.  Concerns regarding medicines are outlined above.  Orders Placed This Encounter  Procedures  . CT CARDIAC SCORING  . EKG 12-Lead   Meds ordered this encounter  Medications  . amLODipine (NORVASC) 10 MG tablet    Sig: Take 1 tablet (10 mg total) by mouth daily.    Dispense:  90 tablet    Refill:  3    Patient Instructions  Medication Instructions:  Your Physician recommend you make the following changes to your medication. Increase: Amlodipine 10 mg daily  If you need a refill on your cardiac medications before your next appointment, please call your pharmacy.   Lab work: None  Testing/Procedures: Calcium Score [address]  Follow-Up: At [facility], you and your health needs are our priority.  As part of our continuing mission to provide you with exceptional heart care, we  have created designated Provider Care Teams.  These Care Teams include your primary Cardiologist (physician) and Advanced Practice Providers (APPs -  Physician Assistants and Nurse Practitioners) who all work together to provide you with the care you need, when you need it. You will need a follow  up appointment in 2 months.  Please call our office 2 months in advance to schedule this appointment.  You may see Dr. Harrell Gave or one of the following Advanced Practice Providers on your designated Care Team:   Rosaria Ferries, PA-C . Jory Sims, DNP, ANP        Signed, Buford Dresser, MD PhD 07/07/2018 6:52 PM    [place]

## 2018-07-07 NOTE — Patient Instructions (Addendum)
Medication Instructions:  Your Physician recommend you make the following changes to your medication. Increase: Amlodipine 10 mg daily  If you need a refill on your cardiac medications before your next appointment, please call your pharmacy.   Lab work: None  Testing/Procedures: Calcium Score [address]  Follow-Up: At [facility], you and your health needs are our priority.  As part of our continuing mission to provide you with exceptional heart care, we have created designated Provider Care Teams.  These Care Teams include your primary Cardiologist (physician) and Advanced Practice Providers (APPs -  Physician Assistants and Nurse Practitioners) who all work together to provide you with the care you need, when you need it. You will need a follow up appointment in 2 months.  Please call our office 2 months in advance to schedule this appointment.  You may see Dr. Harrell Gave or one of the following Advanced Practice Providers on your designated Care Team:   Rosaria Ferries, PA-C . Jory Sims, DNP, ANP

## 2018-07-26 ENCOUNTER — Inpatient Hospital Stay: Admission: RE | Admit: 2018-07-26 | Payer: Medicare HMO | Source: Ambulatory Visit

## 2018-08-08 ENCOUNTER — Telehealth: Payer: Self-pay | Admitting: Cardiology

## 2018-08-08 NOTE — Telephone Encounter (Signed)
Returned call to patient he wanted to know if he was at more risk of getting the coronavirus.Advised of keeping his hands washed frequently,keeping hands off face and staying away from crowds.

## 2018-08-08 NOTE — Telephone Encounter (Signed)
New Message   Patient states he's had a few abnormal EKG's and they all came out the same so he just wants to know if his High Blood Pressure is an underlining issue for the Virus that he has.  Would like a nurse to call him back to discuss issue.

## 2018-08-11 DIAGNOSIS — R69 Illness, unspecified: Secondary | ICD-10-CM | POA: Diagnosis not present

## 2018-08-11 DIAGNOSIS — R0982 Postnasal drip: Secondary | ICD-10-CM | POA: Diagnosis not present

## 2018-08-11 DIAGNOSIS — F411 Generalized anxiety disorder: Secondary | ICD-10-CM | POA: Diagnosis not present

## 2018-08-20 DIAGNOSIS — S0185XA Open bite of other part of head, initial encounter: Secondary | ICD-10-CM | POA: Diagnosis not present

## 2018-08-25 DIAGNOSIS — R69 Illness, unspecified: Secondary | ICD-10-CM | POA: Diagnosis not present

## 2018-08-25 DIAGNOSIS — R5383 Other fatigue: Secondary | ICD-10-CM | POA: Diagnosis not present

## 2018-08-25 DIAGNOSIS — H409 Unspecified glaucoma: Secondary | ICD-10-CM | POA: Diagnosis not present

## 2018-08-25 DIAGNOSIS — F411 Generalized anxiety disorder: Secondary | ICD-10-CM | POA: Diagnosis not present

## 2018-08-25 DIAGNOSIS — E782 Mixed hyperlipidemia: Secondary | ICD-10-CM | POA: Diagnosis not present

## 2018-09-02 ENCOUNTER — Telehealth: Payer: Self-pay

## 2018-09-02 NOTE — Telephone Encounter (Signed)
   Primary Cardiologist:  Buford Dresser, MD   Patient contacted.  History reviewed.  No symptoms to suggest any unstable cardiac conditions.  Based on discussion, with current pandemic situation, we will be postponing this appointment for Amalia Hailey with a plan for f/u in >12 wks or sooner if feasible/necessary.  If symptoms change, he has been instructed to contact our office.   Routing to C19 CANCEL pool for tracking (P CV DIV CV19 CANCEL - reason for visit "other.") and assigning priority (1 = 4-6 wks, 2 = 6-12 wks, 3 = >12 wks).   Donivan Scull, [place]  09/02/2018 9:26 AM

## 2018-09-05 ENCOUNTER — Ambulatory Visit: Payer: Medicare HMO | Admitting: Cardiology

## 2018-09-05 ENCOUNTER — Telehealth: Payer: Self-pay

## 2018-09-05 NOTE — Telephone Encounter (Signed)
Follow  Up ° ° ° ° °Pt is returning call ° ° ° °Please call back  °

## 2018-09-05 NOTE — Telephone Encounter (Signed)
-----   Message from Buford Dresser, MD sent at 09/02/2018  2:29 PM EDT ----- Regarding: RE: patient question No problem, we can always reassess calcium score in the future. We do not know enough about the coronavirus yet to determine who is more likely to get it, as the number of asymptomatic carriers in the [place] is unknown. I recommend continuing social distancing and follow CDC guidelines, as they have been updating all of [place] nearly daily. Thanks! ----- Message ----- From: Diana Eves, CMA Sent: 09/02/2018   9:27 AM EDT To: Meryl Crutch, RN, Buford Dresser, MD Subject: patient question                               Hi!!  Patient wanted me to let you know that he cancelled his cardiac score. He left if wasn't worth the risk (radiation).  He also inquires his whether he is at a greater risk for acquiring COVID-19 given his condition. Please advise.  Thanks, [employer]

## 2018-09-05 NOTE — Telephone Encounter (Signed)
Left message to call back  

## 2018-09-07 NOTE — Telephone Encounter (Signed)
Called patient, had a lengthy conversation regarding patient not choosing to do the Calcium scoring, and the covid 19- patient had a question if moving his medications to a different time of day would be beneficial for him. Advised I would route message to Dr.Chrisopher and nurse.

## 2018-09-07 NOTE — Telephone Encounter (Signed)
F/U Message            Patient has returned John Bell's call x2 and he was told that she would call him back, patient has not heard anything back yet. Pls call patient and advise.

## 2018-09-07 NOTE — Telephone Encounter (Signed)
In terms of medications, there is some evidence that moving blood pressure medications to the evening can be helpful in people with difficult control, though amlodipine is long acting and less affected by timing. In terms of the red yeast rice and omega-3 fatty acids, there is no evidence that time of day is influential on these medications.  To reiterate, the calcium score is optional but will help guide [place] with risk. Not mandatory. It is most helpful in identifying patients that would benefit from statin therapy.  In terms of COVID risk, right now his only risk is high blood pressure. I am recommending that everyone follow CDC guidelines for social distancing and mask use.   Thank you!

## 2018-09-07 NOTE — Telephone Encounter (Signed)
Pt updated with Dr. Judeth Cornfield recommendation and voiced understanding.

## 2018-09-23 NOTE — Telephone Encounter (Signed)
Called pt, left VM for him to call and schedule appt in July or August.  Rosaria Ferries, PA-C 09/23/2018 3:48 PM [place] [phone]

## 2018-09-30 ENCOUNTER — Telehealth: Payer: Self-pay | Admitting: Cardiology

## 2018-09-30 NOTE — Telephone Encounter (Signed)
lmtcb - need to r/s April appt,  Please schedule virtual visit week of May 4 in an open spot.

## 2018-10-05 ENCOUNTER — Telehealth: Payer: Medicare HMO | Admitting: Cardiology

## 2019-02-03 ENCOUNTER — Encounter: Payer: Self-pay | Admitting: Cardiology

## 2019-02-07 ENCOUNTER — Ambulatory Visit: Payer: Medicare HMO | Admitting: Cardiology

## 2019-02-25 DIAGNOSIS — R69 Illness, unspecified: Secondary | ICD-10-CM | POA: Diagnosis not present

## 2019-03-06 DIAGNOSIS — H15833 Staphyloma posticum, bilateral: Secondary | ICD-10-CM | POA: Diagnosis not present

## 2019-03-06 DIAGNOSIS — H401133 Primary open-angle glaucoma, bilateral, severe stage: Secondary | ICD-10-CM | POA: Diagnosis not present

## 2019-03-06 DIAGNOSIS — H4423 Degenerative myopia, bilateral: Secondary | ICD-10-CM | POA: Diagnosis not present

## 2019-03-31 ENCOUNTER — Other Ambulatory Visit: Payer: Self-pay

## 2019-03-31 ENCOUNTER — Encounter: Payer: Self-pay | Admitting: Cardiology

## 2019-03-31 ENCOUNTER — Ambulatory Visit: Payer: Medicare HMO | Admitting: Cardiology

## 2019-03-31 VITALS — BP 134/80 | HR 68 | Temp 96.8°F | Ht 70.0 in | Wt 220.0 lb

## 2019-03-31 DIAGNOSIS — I10 Essential (primary) hypertension: Secondary | ICD-10-CM | POA: Diagnosis not present

## 2019-03-31 DIAGNOSIS — E6609 Other obesity due to excess calories: Secondary | ICD-10-CM | POA: Diagnosis not present

## 2019-03-31 DIAGNOSIS — Z7189 Other specified counseling: Secondary | ICD-10-CM | POA: Diagnosis not present

## 2019-03-31 DIAGNOSIS — E66811 Obesity, class 1: Secondary | ICD-10-CM

## 2019-03-31 DIAGNOSIS — E7800 Pure hypercholesterolemia, unspecified: Secondary | ICD-10-CM | POA: Diagnosis not present

## 2019-03-31 DIAGNOSIS — Z6834 Body mass index (BMI) 34.0-34.9, adult: Secondary | ICD-10-CM

## 2019-03-31 NOTE — Progress Notes (Signed)
Cardiology Office Note:    Date:  03/31/2019   ID:  John Bell, DOB [birth year], MRN [id number]  PCP:  Marda Stalker, PA-C  Cardiologist:  Buford Dresser, MD PhD  Referring MD: Dineen Kid, MD   CC: follow up  History of Present Illness:    John Bell is a 58 y.o. male with a hx of hypertension, hyperlipidemia, legal blindness who is seen for follow up of chest pain. I met him initially in the hospital for new consultation on 03/06/18. He had a nuclear stress test at that time. He followed up with Jory Sims on 03/21/18.  Today: Doing well overall. Denies symptoms. Exercising on elliptical daily for 30 minutes, does high intensity activity as well. He has lost weight, but is very frustrated that his goal BMI of 25 is too low. States he has not been that weight in decades, every when an athletic young adult. We discussed benefits of BMI 30 or less, which means aiming for weight less than 210 lbs for his height. Discussed from CV perspective, data suggests that being active and BMI less than 30 is good for long term mortality. Discussed lipids, calcium score, management, etc at length today. We discussed data for this. After shared decision making, he elects not to pursue cardiac scoring, because even an elevated number would not convince him to take statin.  Denies chest pain, shortness of breath at rest or with normal exertion. No PND, orthopnea, LE edema or unexpected weight gain. No syncope or palpitations.  Past Medical History:  Diagnosis Date  . Asthma     Past Surgical History:  Procedure Laterality Date  . EYE SURGERY     Legally Blind    Current Medications: Current Outpatient Medications on File Prior to Visit  Medication Sig  . amLODipine (NORVASC) 10 MG tablet Take 1 tablet (10 mg total) by mouth daily.  . brimonidine (ALPHAGAN) 0.2 % ophthalmic solution Place 1 drop into both eyes daily.   . dorzolamide (TRUSOPT) 2 % ophthalmic solution Place 1  drop into both eyes 2 (two) times daily.  Marland Kitchen latanoprost (XALATAN) 0.005 % ophthalmic solution Apply 1 drop to eye at bedtime.  . Loratadine (CLARITIN) 10 MG CAPS Take 10 mg by mouth daily.  . Omega-3 Fatty Acids (OMEGA 3 PO) Take 2 capsules by mouth 2 (two) times daily.  . Red Yeast Rice Extract (RED YEAST RICE PO) Take 2 capsules by mouth daily.  . timolol (BETIMOL) 0.25 % ophthalmic solution Place 1 drop into both eyes at bedtime.  . timolol (TIMOPTIC) 0.5 % ophthalmic solution Place 1 drop into both eyes 2 (two) times daily.   No current facility-administered medications on file prior to visit.      Allergies:   Codeine   Social History   Socioeconomic History  . Marital status: Married    Spouse name: Not on file  . Number of children: Not on file  . Years of education: Not on file  . Highest education level: Not on file  Occupational History  . Not on file  Social Needs  . Financial resource strain: Not on file  . Food insecurity    Worry: Not on file    Inability: Not on file  . Transportation needs    Medical: Not on file    Non-medical: Not on file  Tobacco Use  . Smoking status: Never Smoker  . Smokeless tobacco: Never Used  Substance and Sexual Activity  . Alcohol use: Never  Frequency: Never  . Drug use: Never  . Sexual activity: Yes  Lifestyle  . Physical activity    Days per week: Not on file    Minutes per session: Not on file  . Stress: Not on file  Relationships  . Social Herbalist on phone: Not on file    Gets together: Not on file    Attends religious service: Not on file    Active member of club or organization: Not on file    Attends meetings of clubs or organizations: Not on file    Relationship status: Not on file  Other Topics Concern  . Not on file  Social History Narrative  . Not on file     Family History: The patient's family history includes Breast cancer in his sister; CAD in his father; Hypertension in his brother  and father; Liver disease in his mother.  ROS:   Please see the history of present illness.  Additional pertinent ROS: .Constitutional: Negative for chills, fever, night sweats, unintentional weight loss  HENT: Negative for ear pain and hearing loss.   Eyes: Negative for eye pain. Chronic vision loss Respiratory: Negative for cough, sputum, wheezing.   Cardiovascular: See HPI. Gastrointestinal: Negative for abdominal pain, melena, and hematochezia.  Genitourinary: Negative for dysuria and hematuria.  Musculoskeletal: Negative for falls and myalgias.  Skin: Negative for itching and rash.  Neurological: Negative for focal weakness, focal sensory changes and loss of consciousness.  Endo/Heme/Allergies: Does not bruise/bleed easily.   EKGs/Labs/Other Studies Reviewed:    The following studies were reviewed today: NM Stress Test 03/07/2018  Blood pressure demonstrated a hypertensive response to exercise.  Downsloping ST segment depression ST segment depression of 1 mm was noted during stress in the V5 and V6 leads.  Findings consistent with ischemia.  This is a low risk study.  The left ventricular ejection fraction is mildly decreased (45-54%).  SDS 5 with mild ischemia in the true apex as well as mid and apical septum Note there is some soft tissue attenuation In this are on RAW images. Baseline ECG abnormal but concern for 1 mm ST depression in inferior and anterolateral leads With T wave inversions V45 with stress and recovery EF estimated at 50% but looks normal   Echocardiogram 03/06/2018 Procedure narrative: Transthoracic echocardiography. Image quality was suboptimal. The study was technically difficult, as a result of poor acoustic windows, poor sound wave transmission, and body habitus. Intravenous contrast (Definity) was administered. - Left ventricle: Poorly visualized. The cavity size was normal. Systolic function was vigorous. The estimated ejection  fraction was in the range of 65% to 70%. Wall motion was normal; there were no regional wall motion abnormalities. Left ventricular diastolic function parameters were normal.  EKG:  EKG is personally reviewed.  The ekg ordered today demonstrates NSR, nonspecific TW changes similar to prior, single PVC  Recent Labs: No results found for requested labs within last 8760 hours.  Recent Lipid Panel    Component Value Date/Time   CHOL 206 (H) 03/06/2018 0450   TRIG 130 03/06/2018 0450   HDL 40 (L) 03/06/2018 0450   CHOLHDL 5.2 03/06/2018 0450   VLDL 26 03/06/2018 0450   LDLCALC 140 (H) 03/06/2018 0450    Physical Exam:    VS:  BP 134/80 (BP Location: Left Arm, Patient Position: Sitting, Cuff Size: Normal)   Pulse 68   Temp (!) 96.8 F (36 C)   Ht _0  (1.778 m)   Wt 220  lb (99.8 kg)   BMI 31.57 kg/m     Wt Readings from Last 3 Encounters:  03/31/19 220 lb (99.8 kg)  07/07/18 240 lb 3.2 oz (109 kg)  03/21/18 238 lb 6.4 oz (108.1 kg)    GEN: Well nourished, well developed in no acute distress HEENT: Normal, moist mucous membranes NECK: No JVD CARDIAC: regular rhythm, normal S1 and S2, no rubs or gallops. No murmurs. VASCULAR: Radial and DP pulses 2+ bilaterally. No carotid bruits RESPIRATORY:  Clear to auscultation without rales, wheezing or rhonchi  ABDOMEN: Soft, non-tender, non-distended MUSCULOSKELETAL:  Ambulates independently SKIN: Warm and dry, no edema NEUROLOGIC:  Alert and oriented x 3. No focal neuro deficits noted. PSYCHIATRIC:  Normal affect   ASSESSMENT:    1. Essential hypertension   2. Class 1 obesity due to excess calories without serious comorbidity with body mass index (BMI) of 34.0 to 34.9 in adult   3. Hypercholesterolemia   4. Cardiac risk counseling   5. Counseling on health promotion and disease prevention    PLAN:    Hypertension: near goal today. Wants to continue working on diet/exercise -continue amlodipine 10 mg daily   Hyperlipidemia: we discussed cholesterol at length, data for statins, data for calcium score for long term risk assessment. He feels strongly that at this point, he is not interested in a statin even if calcium score is elevated. Shared decision making, elected not to pursue calcium score.  Obesity: encouraged continued diet and exercise  CV risk and prevention counseling: -recommend heart healthy/Mediterranean diet, with whole grains, fruits, vegetable, fish, lean meats, nuts, and olive oil. Limit salt. -recommend moderate walking, 3-5 times/week for 30-50 minutes each session. Aim for at least 150 minutes.week. Goal should be pace of 3 miles/hours, or walking 1.5 miles in 30 minutes -recommend avoidance of tobacco products. Avoid excess alcohol. -ASCVD risk score: The 10-year ASCVD risk score Mikey Bussing DC Brooke Bonito., et al., 2013) is: 11.2%   Values used to calculate the score:     Age: 84 years     Sex: Male     Is Non-Hispanic African American: No     Diabetic: No     Tobacco smoker: No     Systolic Blood Pressure: 269 mmHg     Is BP treated: Yes     HDL Cholesterol: 40 mg/dL     Total Cholesterol: 206 mg/dL   Plan for follow up: 1 year or sooner PRN per patient preference  TIME SPENT WITH PATIENT: 32 minutes of direct patient care. More than 50% of that time was spent on coordination of care and counseling regarding data/recommendations on lipid management.  Buford Dresser, MD, PhD [place]  [facility]   Medication Adjustments/Labs and Tests Ordered: Current medicines are reviewed at length with the patient today.  Concerns regarding medicines are outlined above.  Orders Placed This Encounter  Procedures  . EKG 12-Lead   No orders of the defined types were placed in this encounter.   Patient Instructions  Medication Instructions:  None  *If you need a refill on your cardiac medications before your next appointment, please call your pharmacy*  Lab Work: None   Testing/Procedures: None  Follow-Up: At [facility], you and your health needs are our priority.  As part of our continuing mission to provide you with exceptional heart care, we have created designated Provider Care Teams.  These Care Teams include your primary Cardiologist (physician) and Advanced Practice Providers (APPs -  Physician Assistants and Nurse  Practitioners) who all work together to provide you with the care you need, when you need it.  Your next appointment:   12 months  The format for your next appointment:   In Person  Provider:   Buford Dresser, MD      Signed, Buford Dresser, MD PhD 03/31/2019 10:56 PM    [place]

## 2019-03-31 NOTE — Patient Instructions (Signed)
Medication Instructions:  None  *If you need a refill on your cardiac medications before your next appointment, please call your pharmacy*  Lab Work: None  Testing/Procedures: None  Follow-Up: At [facility], you and your health needs are our priority.  As part of our continuing mission to provide you with exceptional heart care, we have created designated Provider Care Teams.  These Care Teams include your primary Cardiologist (physician) and Advanced Practice Providers (APPs -  Physician Assistants and Nurse Practitioners) who all work together to provide you with the care you need, when you need it.  Your next appointment:   12 months  The format for your next appointment:   In Person  Provider:   Buford Dresser, MD

## 2019-05-02 ENCOUNTER — Telehealth: Payer: Self-pay | Admitting: Cardiology

## 2019-05-02 NOTE — Telephone Encounter (Signed)
Pt state at last office visit, Dr. Harrell Gave recommended pt ask his PCP about prescribing a medication that is good for nasal drip and congestion. Pt state he can't recall the name of the medication but remember MD saying it's a pill you take daily that has to be prescribed.   Will route to MD.

## 2019-05-02 NOTE — Telephone Encounter (Signed)
New message:     Pt said when he had his last appointment with Dr Harrell Gave, they discussed a medicine. The medicine was for nasal drip and congestion. He can not remember the name of the medicine. He would like the name , if Dr Harrell Gave can remember.

## 2019-05-03 NOTE — Telephone Encounter (Signed)
It was probably singulair (montelukast). Thanks.

## 2019-05-03 NOTE — Telephone Encounter (Signed)
Pt updated and verbalized understanding.  

## 2019-05-03 NOTE — Telephone Encounter (Signed)
Left message to call back  

## 2019-06-11 ENCOUNTER — Other Ambulatory Visit: Payer: Self-pay | Admitting: Cardiology

## 2019-06-11 DIAGNOSIS — I10 Essential (primary) hypertension: Secondary | ICD-10-CM

## 2019-07-26 DIAGNOSIS — E782 Mixed hyperlipidemia: Secondary | ICD-10-CM | POA: Diagnosis not present

## 2019-07-26 DIAGNOSIS — R030 Elevated blood-pressure reading, without diagnosis of hypertension: Secondary | ICD-10-CM | POA: Diagnosis not present

## 2019-07-26 DIAGNOSIS — R69 Illness, unspecified: Secondary | ICD-10-CM | POA: Diagnosis not present

## 2019-07-26 DIAGNOSIS — H409 Unspecified glaucoma: Secondary | ICD-10-CM | POA: Diagnosis not present

## 2019-07-26 DIAGNOSIS — F411 Generalized anxiety disorder: Secondary | ICD-10-CM | POA: Diagnosis not present

## 2019-07-26 DIAGNOSIS — N529 Male erectile dysfunction, unspecified: Secondary | ICD-10-CM | POA: Diagnosis not present

## 2019-09-20 ENCOUNTER — Encounter (HOSPITAL_BASED_OUTPATIENT_CLINIC_OR_DEPARTMENT_OTHER): Payer: Self-pay | Admitting: *Deleted

## 2019-09-20 ENCOUNTER — Emergency Department (HOSPITAL_BASED_OUTPATIENT_CLINIC_OR_DEPARTMENT_OTHER)
Admission: EM | Admit: 2019-09-20 | Discharge: 2019-09-20 | Disposition: A | Discharge status: Home / Self Care | Payer: Medicare HMO | Attending: Emergency Medicine | Admitting: Emergency Medicine

## 2019-09-20 ENCOUNTER — Other Ambulatory Visit: Payer: Self-pay

## 2019-09-20 ENCOUNTER — Emergency Department (HOSPITAL_BASED_OUTPATIENT_CLINIC_OR_DEPARTMENT_OTHER): Payer: Medicare HMO

## 2019-09-20 DIAGNOSIS — Z79899 Other long term (current) drug therapy: Secondary | ICD-10-CM | POA: Insufficient documentation

## 2019-09-20 DIAGNOSIS — Y9389 Activity, other specified: Secondary | ICD-10-CM | POA: Insufficient documentation

## 2019-09-20 DIAGNOSIS — M549 Dorsalgia, unspecified: Secondary | ICD-10-CM | POA: Diagnosis not present

## 2019-09-20 DIAGNOSIS — M545 Low back pain, unspecified: Secondary | ICD-10-CM

## 2019-09-20 DIAGNOSIS — M25552 Pain in left hip: Secondary | ICD-10-CM | POA: Diagnosis not present

## 2019-09-20 DIAGNOSIS — W1781XA Fall down embankment (hill), initial encounter: Secondary | ICD-10-CM | POA: Insufficient documentation

## 2019-09-20 DIAGNOSIS — S3992XA Unspecified injury of lower back, initial encounter: Secondary | ICD-10-CM | POA: Diagnosis not present

## 2019-09-20 DIAGNOSIS — Y9289 Other specified places as the place of occurrence of the external cause: Secondary | ICD-10-CM | POA: Diagnosis not present

## 2019-09-20 DIAGNOSIS — Y999 Unspecified external cause status: Secondary | ICD-10-CM | POA: Diagnosis not present

## 2019-09-20 DIAGNOSIS — S79911A Unspecified injury of right hip, initial encounter: Secondary | ICD-10-CM | POA: Diagnosis not present

## 2019-09-20 DIAGNOSIS — M25512 Pain in left shoulder: Secondary | ICD-10-CM | POA: Insufficient documentation

## 2019-09-20 DIAGNOSIS — W19XXXA Unspecified fall, initial encounter: Secondary | ICD-10-CM

## 2019-09-20 DIAGNOSIS — J45909 Unspecified asthma, uncomplicated: Secondary | ICD-10-CM | POA: Insufficient documentation

## 2019-09-20 DIAGNOSIS — M533 Sacrococcygeal disorders, not elsewhere classified: Secondary | ICD-10-CM | POA: Diagnosis not present

## 2019-09-20 DIAGNOSIS — S79912A Unspecified injury of left hip, initial encounter: Secondary | ICD-10-CM | POA: Diagnosis not present

## 2019-09-20 MED ORDER — METHOCARBAMOL 500 MG PO TABS: 500.0000 mg | ORAL_TABLET | Freq: Every evening | ORAL | 0 refills | Status: AC | PRN

## 2019-09-20 MED ORDER — IBUPROFEN 400 MG PO TABS
600.0000 mg | ORAL_TABLET | Freq: Once | ORAL | Status: AC
  Administered 2019-09-20: 600 mg via ORAL
  Filled 2019-09-20: qty 1

## 2019-09-20 NOTE — ED Provider Notes (Signed)
[facility] EMERGENCY DEPARTMENT Provider Note   CSN: [id number] Arrival date & time: 09/20/19  1446     History Chief Complaint  Patient presents with  . Fall    John Bell is a 59 y.o. male presenting for evaluation of low back and left hip pain after a fall.  Patient states around 2:00 this afternoon he fell from 1 level of a [place] to the other.  He fell onto his coccyx, and then rolled falling onto his left side.  He hit his left shoulder and the left side head.  He did not lose consciousness.  He continued to garden afterwards.  Since then, he has had persistent low back/coccyx pain.  He also reports left hip and left shoulder pain.  He denies headache.  He denies vision changes, slurred speech, chest pain, shortness of breath, upper back pain, numbness, tingling, loss of bowel bladder control.  He has not taken anything for pain including Tylenol or ibuprofen.  He reports a history of chronic back pain, but states this feels different.  He is not on blood thinners.  HPI     Past Medical History:  Diagnosis Date  . Asthma     Patient Active Problem List   Diagnosis Date Noted  . Chest pain 03/05/2018  . Elevated BP without diagnosis of hypertension 03/05/2018  . Anxiety 03/05/2018  . Blind 03/05/2018    Past Surgical History:  Procedure Laterality Date  . EYE SURGERY     Legally Blind       Family History  Problem Relation Age of Onset  . CAD Father   . Hypertension Father   . Liver disease Mother   . Breast cancer Sister   . Hypertension Brother     Social History   Tobacco Use  . Smoking status: Never Smoker  . Smokeless tobacco: Never Used  Substance Use Topics  . Alcohol use: Never  . Drug use: Never    Home Medications Prior to Admission medications   Medication Sig Start Date End Date Taking? Authorizing Provider  ALPRAZolam Duanne Moron) 0.5 MG tablet Take 0.5 mg by mouth daily as needed. 07/26/19   [provider]    amLODipine (NORVASC) 10 MG tablet TAKE 1 TABLET BY MOUTH EVERY DAY 06/13/19   Buford Dresser, MD  brimonidine (ALPHAGAN) 0.2 % ophthalmic solution Place 1 drop into both eyes daily.  10/12/14   [provider]  dorzolamide (TRUSOPT) 2 % ophthalmic solution Place 1 drop into both eyes 2 (two) times daily. 03/26/15   [provider]  dorzolamide-timolol (COSOPT) 22.3-6.8 MG/ML ophthalmic solution  08/30/19   [provider]  latanoprost (XALATAN) 0.005 % ophthalmic solution Apply 1 drop to eye at bedtime. 09/30/16   [provider]  methocarbamol (ROBAXIN) 500 MG tablet Take 1 tablet (500 mg total) by mouth at bedtime as needed for muscle spasms. 09/20/19   Zethan Alfieri, PA-C  Omega-3 Fatty Acids (OMEGA 3 PO) Take 2 capsules by mouth 2 (two) times daily.    [provider]  timolol (BETIMOL) 0.25 % ophthalmic solution Place 1 drop into both eyes at bedtime.    [provider]  timolol (TIMOPTIC) 0.5 % ophthalmic solution Place 1 drop into both eyes 2 (two) times daily. 03/29/14   [provider]    Allergies    Codeine  Review of Systems   Review of Systems  Musculoskeletal: Positive for arthralgias and back pain.  All other systems reviewed and are  negative.   Physical Exam Updated Vital Signs BP (!) 154/93 (BP Location: Right Arm)   Pulse 81   Temp 98.1 F (36.7 C) (Oral)   Resp 16   Ht 5\' 10"  (1.778 m)   Wt 95.3 kg   SpO2 99%   BMI 30.13 kg/m   Physical Exam Vitals and nursing note reviewed.  Constitutional:      General: He is not in acute distress.    Appearance: He is well-developed.     Comments: Sitting in the bed in no acute distress  HENT:     Head: Normocephalic and atraumatic.     Comments: No sign of head trauma.  No tenderness palpation of the scalp.  No hemotympanum or nasal septal hematoma.  No trismus or malocclusion. Eyes:     Comments: Patient is legally blind due to glaucoma.  Abnormal  eye exam, patient states is baseline  Neck:     Comments: No tenderness palpation of midline C-spine.  No step-offs or deformities.  Full active range of motion of the head without pain. Cardiovascular:     Rate and Rhythm: Normal rate and regular rhythm.  Pulmonary:     Effort: Pulmonary effort is normal. No respiratory distress.     Breath sounds: Normal breath sounds. No wheezing.  Abdominal:     General: There is no distension.     Palpations: Abdomen is soft. There is no mass.     Tenderness: There is no abdominal tenderness. There is no guarding or rebound.  Musculoskeletal:        General: Tenderness present. Normal range of motion.     Cervical back: Normal range of motion and neck supple.     Comments: Tenderness palpation of coccyx.  Tenderness palpation of the left lateral hip.  No obvious deformity.  Mild tenderness palpation of the lateral left shoulder without deformity or swelling.  Radial pedal pulses 2+ bilaterally.  Strength and sensation intact x4.  Patient is ambulatory.  He is able to go from laying to sitting without significant difficulty or pain.  No contusions or hematomas.  Skin:    General: Skin is warm and dry.     Capillary Refill: Capillary refill takes less than 2 seconds.  Neurological:     Mental Status: He is alert and oriented to person, place, and time.     ED Results / Procedures / Treatments   Labs (all labs ordered are listed, but only abnormal results are displayed) Labs Reviewed - No data to display  EKG None  Radiology DG Sacrum/Coccyx  Result Date: 09/20/2019 CLINICAL DATA:  Fall with tailbone pain EXAM: SACRUM AND COCCYX - 2+ VIEW COMPARISON:  None. FINDINGS: There is no evidence of fracture or other focal bone lesions. IMPRESSION: Negative. Electronically Signed   By: Donavan Foil M.D.   On: 09/20/2019 17:44   DG HIPS BILAT WITH PELVIS MIN 5 VIEWS  Result Date: 09/20/2019 CLINICAL DATA:  Fall with back pain EXAM: DG HIP (WITH OR  WITHOUT PELVIS) 5+V BILAT COMPARISON:  None. FINDINGS: Mild SI joint degenerative change. Pubic symphysis and rami are intact. Possible step-off deformity at the left femoral head neck junction. Spurring at the femoral head neck junction. Mild degenerative changes of both hips. IMPRESSION: 1. No dislocation.  Degenerative changes of both hips. 2. Slight angular deformity at the left femoral head neck junction, potentially due to femoral head neck spurring however if clinical suspicion is high for hip fracture, further evaluation with CT  would be recommended. Electronically Signed   By: Donavan Foil M.D.   On: 09/20/2019 17:44    Procedures Procedures (including critical care time)  Medications Ordered in ED Medications  ibuprofen (ADVIL) tablet 600 mg (600 mg Oral Given 09/20/19 1735)    ED Course  I have reviewed the triage vital signs and the nursing notes.  Pertinent labs & imaging results that were available during my care of the patient were reviewed by me and considered in my medical decision making (see chart for details).    MDM Rules/Calculators/A&P                      Patient presenting for evaluation of left hip, low back, and left shoulder pain after fall.  On exam, patient appears nontoxic.  He is neurovascularly intact.  No sign of head trauma.  Approximately 4 hours after the fall, he is not having any nausea, vomiting, headaches, or concerning signs for intracranial injury.  As such, I do not believe he needs a head CT today.  He does have reproducible pain over the coccyx and left hip, as such we will obtain x-rays.  Shoulder pain is minimal, mostly on the lateral aspect.  Fluid range of motion.  Low suspicion for fracture, will obtain x-ray of the shoulder.  X-ray of the pelvis and coccyx viewed interpreted by me, no obvious fracture or dislocation.  Does show degenerative changes.  Per radiology, there is a deformity of the left femoral head potentially due to spurring.  As  patient is able to ambulate and move without difficulty, I do not believe he needs CT as I have low suspicion for fracture (or operable fx).  Discussed findings with patient.  Discussed symptomatic management at home.  Will give short course of muscle relaxers to help with pain control.  Encourage follow-up with primary care symptoms not proving.  Strict return precautions given for head injury.  At this time, patient appears safe for discharge.  Patient states he understands and agrees to plan.  Final Clinical Impression(s) / ED Diagnoses Final diagnoses:  Acute left-sided low back pain without sciatica  Left hip pain  Acute pain of left shoulder  Fall, initial encounter    Rx / DC Orders ED Discharge Orders         Ordered    methocarbamol (ROBAXIN) 500 MG tablet  At bedtime PRN     09/20/19 1753           Franchot Heidelberg, PA-C 09/20/19 1819    Charlesetta Shanks, MD 09/25/19 2358

## 2019-09-20 NOTE — ED Triage Notes (Signed)
Fall x 1 hrs ago from standing landing on left hip and hitting head on dirt. Denies LOC

## 2019-09-20 NOTE — Discharge Instructions (Addendum)
Continue taking home medications as prescribed. Take ibuprofen 3 times a day with meals.  Do not take other anti-inflammatories at the same time (Advil, Motrin, naproxen, Aleve). You may supplement with Tylenol if you need further pain control. Use ice packs or heating pads if this helps control your pain. Use muscle creams or patches such as Salonpas, icy hot, BenGay, Biofreeze to help with pain control. Follow-up with your primary care doctor in 1 week if symptoms not proving. Return to the emergency room if you develop severe headache, vision changes, vomiting, difficulty walking in a straight line, loss of bowel bladder control, numbness, or any new, worsening, or concerning symptoms.

## 2019-09-25 DIAGNOSIS — N529 Male erectile dysfunction, unspecified: Secondary | ICD-10-CM | POA: Diagnosis not present

## 2019-09-25 DIAGNOSIS — Z23 Encounter for immunization: Secondary | ICD-10-CM | POA: Diagnosis not present

## 2019-09-25 DIAGNOSIS — L739 Follicular disorder, unspecified: Secondary | ICD-10-CM | POA: Diagnosis not present

## 2019-09-25 DIAGNOSIS — R030 Elevated blood-pressure reading, without diagnosis of hypertension: Secondary | ICD-10-CM | POA: Diagnosis not present

## 2019-09-25 DIAGNOSIS — H409 Unspecified glaucoma: Secondary | ICD-10-CM | POA: Diagnosis not present

## 2019-09-25 DIAGNOSIS — Z1211 Encounter for screening for malignant neoplasm of colon: Secondary | ICD-10-CM | POA: Diagnosis not present

## 2019-09-25 DIAGNOSIS — Z125 Encounter for screening for malignant neoplasm of prostate: Secondary | ICD-10-CM | POA: Diagnosis not present

## 2019-09-25 DIAGNOSIS — E782 Mixed hyperlipidemia: Secondary | ICD-10-CM | POA: Diagnosis not present

## 2019-09-25 DIAGNOSIS — Z0000 Encounter for general adult medical examination without abnormal findings: Secondary | ICD-10-CM | POA: Diagnosis not present

## 2019-09-25 DIAGNOSIS — R69 Illness, unspecified: Secondary | ICD-10-CM | POA: Diagnosis not present

## 2019-11-03 DIAGNOSIS — R69 Illness, unspecified: Secondary | ICD-10-CM | POA: Diagnosis not present

## 2019-11-17 DIAGNOSIS — R69 Illness, unspecified: Secondary | ICD-10-CM | POA: Diagnosis not present

## 2019-12-01 DIAGNOSIS — R69 Illness, unspecified: Secondary | ICD-10-CM | POA: Diagnosis not present

## 2019-12-15 DIAGNOSIS — R69 Illness, unspecified: Secondary | ICD-10-CM | POA: Diagnosis not present

## 2019-12-29 DIAGNOSIS — R69 Illness, unspecified: Secondary | ICD-10-CM | POA: Diagnosis not present

## 2020-02-02 DIAGNOSIS — R69 Illness, unspecified: Secondary | ICD-10-CM | POA: Diagnosis not present

## 2020-02-16 DIAGNOSIS — R69 Illness, unspecified: Secondary | ICD-10-CM | POA: Diagnosis not present

## 2020-02-28 ENCOUNTER — Telehealth: Payer: Self-pay | Admitting: Cardiology

## 2020-02-28 NOTE — Telephone Encounter (Signed)
lvm for patient to return call to get follow up scheduled with Christopher from recall list 

## 2020-03-01 DIAGNOSIS — R69 Illness, unspecified: Secondary | ICD-10-CM | POA: Diagnosis not present

## 2020-03-14 ENCOUNTER — Ambulatory Visit: Payer: Medicare HMO | Admitting: Cardiology

## 2020-03-15 DIAGNOSIS — R69 Illness, unspecified: Secondary | ICD-10-CM | POA: Diagnosis not present

## 2020-03-27 DIAGNOSIS — E782 Mixed hyperlipidemia: Secondary | ICD-10-CM | POA: Diagnosis not present

## 2020-03-27 DIAGNOSIS — I10 Essential (primary) hypertension: Secondary | ICD-10-CM | POA: Diagnosis not present

## 2020-03-27 DIAGNOSIS — H409 Unspecified glaucoma: Secondary | ICD-10-CM | POA: Diagnosis not present

## 2020-03-27 DIAGNOSIS — R69 Illness, unspecified: Secondary | ICD-10-CM | POA: Diagnosis not present

## 2020-04-09 ENCOUNTER — Telehealth: Payer: Self-pay | Admitting: *Deleted

## 2020-04-09 NOTE — Telephone Encounter (Signed)
John Bell will call back when ready to schedule his follow up visit. He is vision Impaired will need to have someone with him.

## 2020-04-19 ENCOUNTER — Encounter: Payer: Self-pay | Admitting: Cardiology

## 2020-04-24 DIAGNOSIS — H409 Unspecified glaucoma: Secondary | ICD-10-CM | POA: Diagnosis not present

## 2020-04-24 DIAGNOSIS — R69 Illness, unspecified: Secondary | ICD-10-CM | POA: Diagnosis not present

## 2020-04-24 DIAGNOSIS — E782 Mixed hyperlipidemia: Secondary | ICD-10-CM | POA: Diagnosis not present

## 2020-04-24 DIAGNOSIS — I10 Essential (primary) hypertension: Secondary | ICD-10-CM | POA: Diagnosis not present

## 2020-05-03 DIAGNOSIS — R69 Illness, unspecified: Secondary | ICD-10-CM | POA: Diagnosis not present

## 2020-05-17 DIAGNOSIS — R69 Illness, unspecified: Secondary | ICD-10-CM | POA: Diagnosis not present

## 2020-05-30 DIAGNOSIS — R69 Illness, unspecified: Secondary | ICD-10-CM | POA: Diagnosis not present

## 2020-05-30 DIAGNOSIS — H409 Unspecified glaucoma: Secondary | ICD-10-CM | POA: Diagnosis not present

## 2020-05-30 DIAGNOSIS — I10 Essential (primary) hypertension: Secondary | ICD-10-CM | POA: Diagnosis not present

## 2020-05-30 DIAGNOSIS — E782 Mixed hyperlipidemia: Secondary | ICD-10-CM | POA: Diagnosis not present

## 2020-06-06 DIAGNOSIS — I10 Essential (primary) hypertension: Secondary | ICD-10-CM | POA: Diagnosis not present

## 2020-06-06 DIAGNOSIS — E782 Mixed hyperlipidemia: Secondary | ICD-10-CM | POA: Diagnosis not present

## 2020-06-06 DIAGNOSIS — R69 Illness, unspecified: Secondary | ICD-10-CM | POA: Diagnosis not present

## 2020-06-06 DIAGNOSIS — H409 Unspecified glaucoma: Secondary | ICD-10-CM | POA: Diagnosis not present

## 2020-06-20 ENCOUNTER — Other Ambulatory Visit: Payer: Self-pay | Admitting: Cardiology

## 2020-06-20 DIAGNOSIS — I10 Essential (primary) hypertension: Secondary | ICD-10-CM

## 2020-06-24 DIAGNOSIS — L309 Dermatitis, unspecified: Secondary | ICD-10-CM | POA: Diagnosis not present

## 2020-06-24 DIAGNOSIS — L821 Other seborrheic keratosis: Secondary | ICD-10-CM | POA: Diagnosis not present

## 2020-07-01 IMAGING — DX DG HIP (WITH OR WITHOUT PELVIS) 5+V BILAT
5 series · 5 of 5 positions shown · non-contrast
Comparison: None.

CLINICAL DATA: Fall with back pain

EXAM:
DG HIP (WITH OR WITHOUT PELVIS) 5+V BILAT

[pelvis ap]
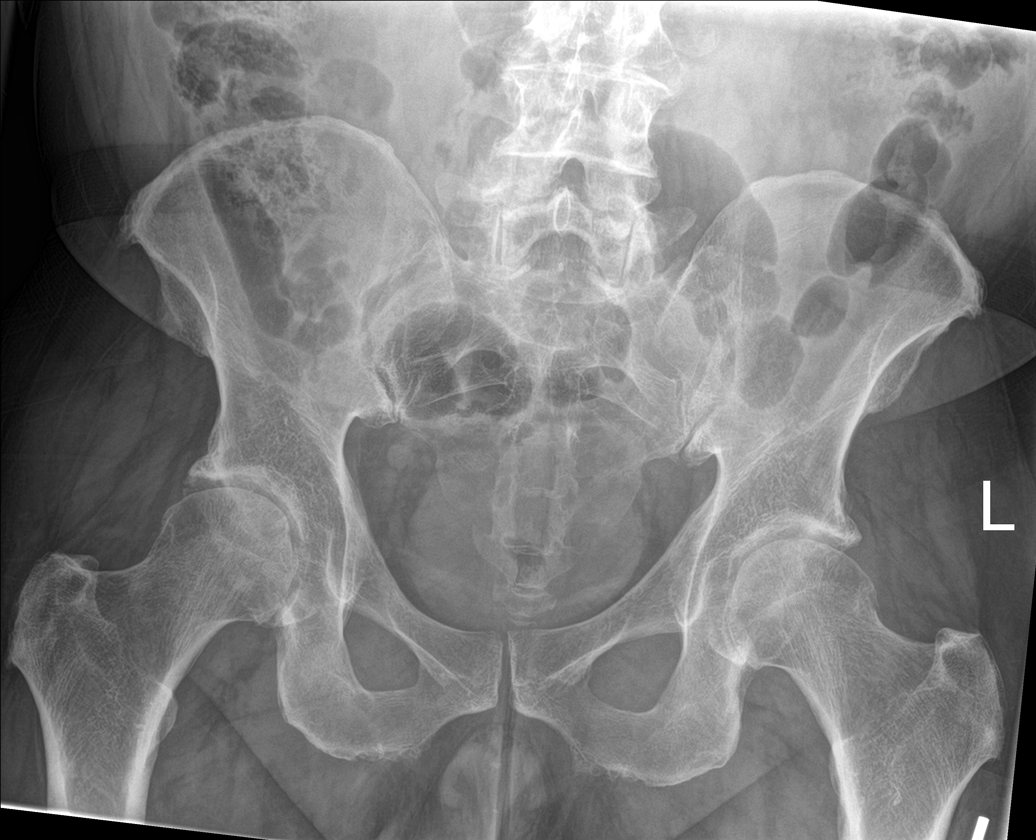

[hip ap (1 of 2)]
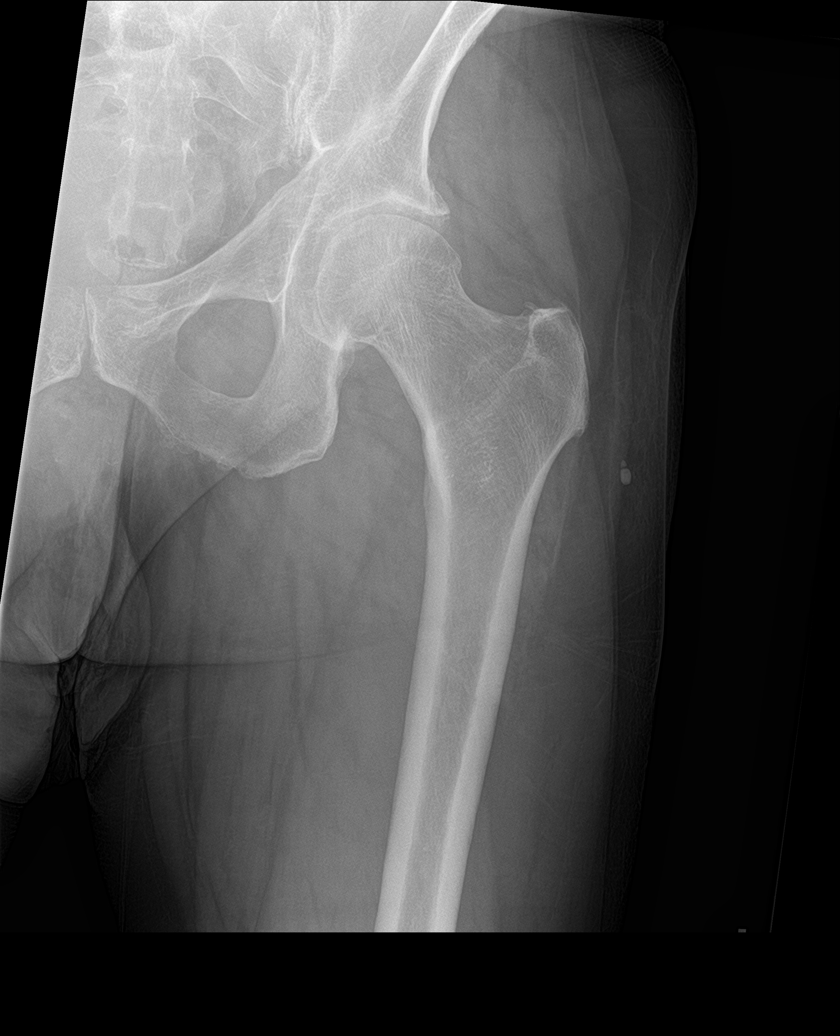

[hip ap (2 of 2)]
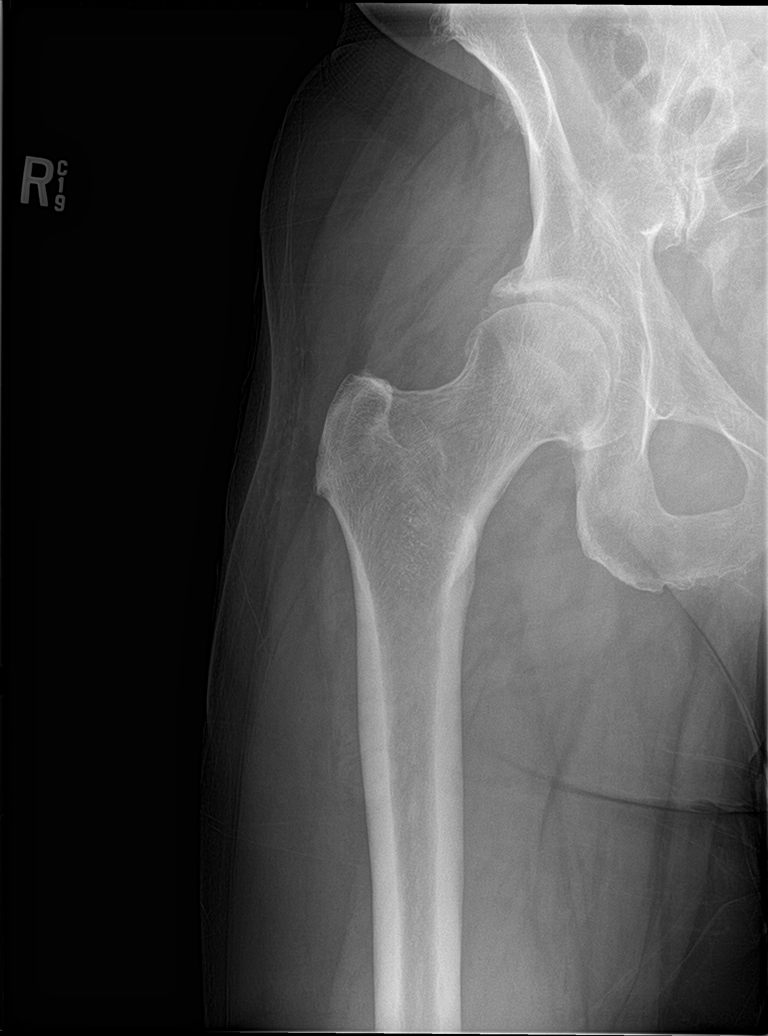

[hip frog leg (1 of 2)]
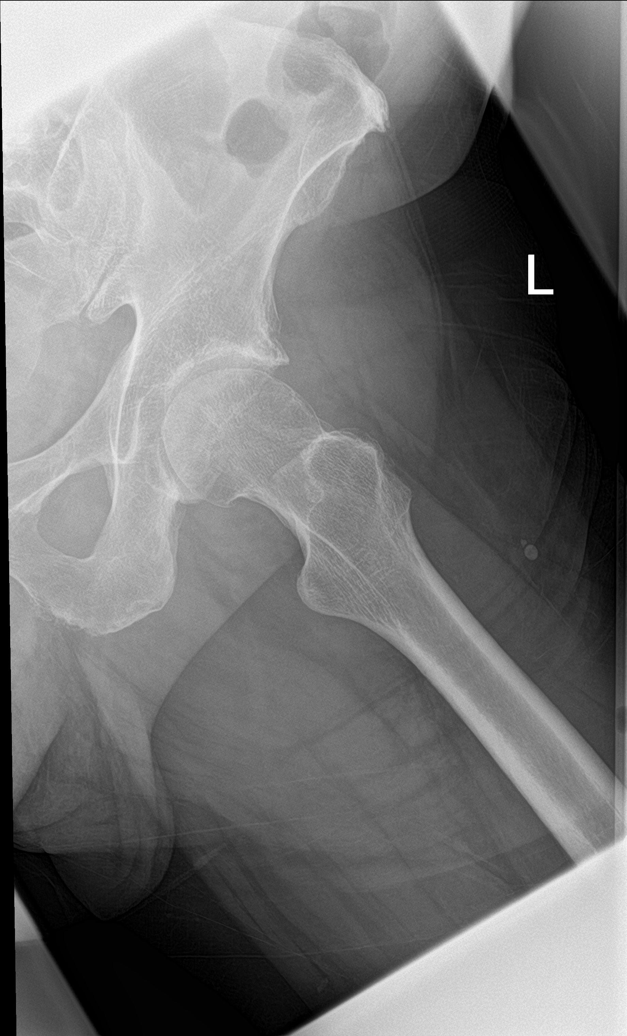

[hip frog leg (2 of 2)]
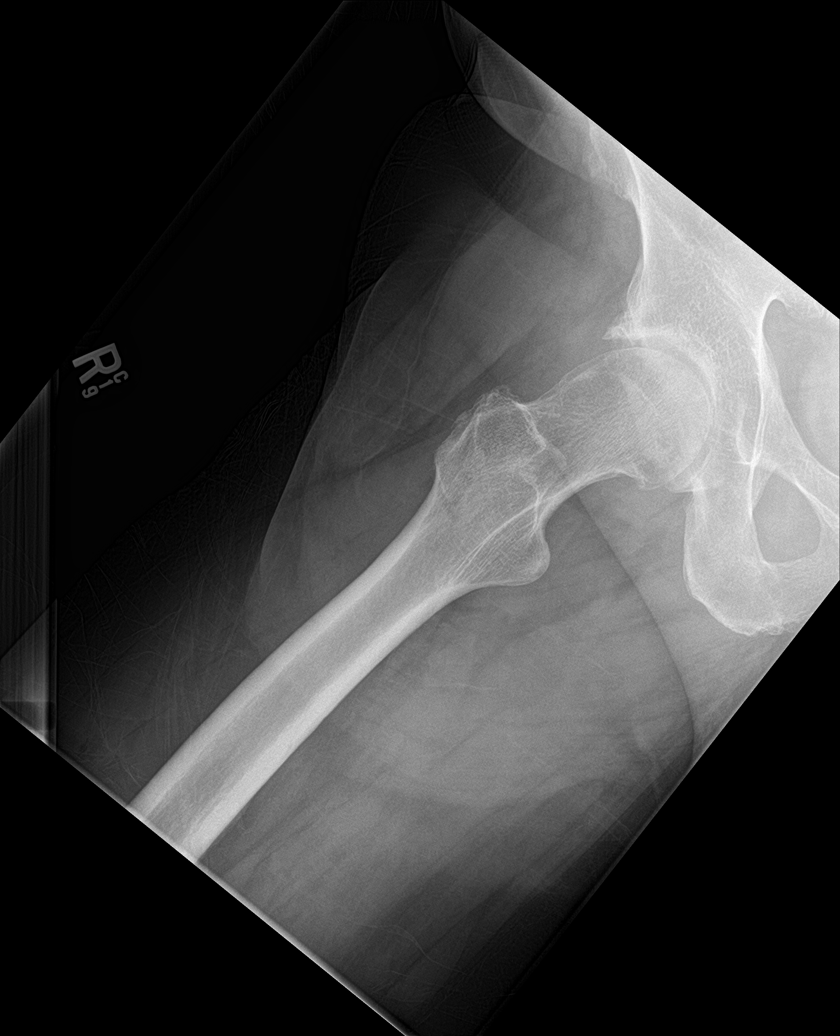

[5 of 5 positions shown; findings below may reference images not displayed]

FINDINGS: Mild SI joint degenerative change. Pubic symphysis and rami are
intact. Possible step-off deformity at the left femoral head neck
junction. Spurring at the femoral head neck junction. Mild
degenerative changes of both hips.
IMPRESSION: 1. No dislocation.  Degenerative changes of both hips.
2. Slight angular deformity at the left femoral head neck junction,
potentially due to femoral head neck spurring however if clinical
suspicion is high for hip fracture, further evaluation with CT would
be recommended.

## 2020-07-22 DIAGNOSIS — R69 Illness, unspecified: Secondary | ICD-10-CM | POA: Diagnosis not present

## 2020-07-22 DIAGNOSIS — H409 Unspecified glaucoma: Secondary | ICD-10-CM | POA: Diagnosis not present

## 2020-07-22 DIAGNOSIS — I10 Essential (primary) hypertension: Secondary | ICD-10-CM | POA: Diagnosis not present

## 2020-07-22 DIAGNOSIS — E782 Mixed hyperlipidemia: Secondary | ICD-10-CM | POA: Diagnosis not present

## 2020-08-05 DIAGNOSIS — R69 Illness, unspecified: Secondary | ICD-10-CM | POA: Diagnosis not present

## 2020-08-05 DIAGNOSIS — I10 Essential (primary) hypertension: Secondary | ICD-10-CM | POA: Diagnosis not present

## 2020-08-05 DIAGNOSIS — E782 Mixed hyperlipidemia: Secondary | ICD-10-CM | POA: Diagnosis not present

## 2020-08-05 DIAGNOSIS — H409 Unspecified glaucoma: Secondary | ICD-10-CM | POA: Diagnosis not present

## 2020-08-20 ENCOUNTER — Other Ambulatory Visit: Payer: Self-pay | Admitting: Cardiology

## 2020-08-20 DIAGNOSIS — H3530 Unspecified macular degeneration: Secondary | ICD-10-CM | POA: Diagnosis not present

## 2020-08-20 DIAGNOSIS — H401133 Primary open-angle glaucoma, bilateral, severe stage: Secondary | ICD-10-CM | POA: Diagnosis not present

## 2020-08-20 DIAGNOSIS — I10 Essential (primary) hypertension: Secondary | ICD-10-CM

## 2020-09-25 DIAGNOSIS — H409 Unspecified glaucoma: Secondary | ICD-10-CM | POA: Diagnosis not present

## 2020-09-25 DIAGNOSIS — R69 Illness, unspecified: Secondary | ICD-10-CM | POA: Diagnosis not present

## 2020-09-25 DIAGNOSIS — I10 Essential (primary) hypertension: Secondary | ICD-10-CM | POA: Diagnosis not present

## 2020-09-25 DIAGNOSIS — E782 Mixed hyperlipidemia: Secondary | ICD-10-CM | POA: Diagnosis not present

## 2020-10-03 DIAGNOSIS — H409 Unspecified glaucoma: Secondary | ICD-10-CM | POA: Diagnosis not present

## 2020-10-03 DIAGNOSIS — H547 Unspecified visual loss: Secondary | ICD-10-CM | POA: Diagnosis not present

## 2020-10-03 DIAGNOSIS — R69 Illness, unspecified: Secondary | ICD-10-CM | POA: Diagnosis not present

## 2020-10-03 DIAGNOSIS — Z008 Encounter for other general examination: Secondary | ICD-10-CM | POA: Diagnosis not present

## 2020-10-03 DIAGNOSIS — I10 Essential (primary) hypertension: Secondary | ICD-10-CM | POA: Diagnosis not present

## 2020-10-03 DIAGNOSIS — N529 Male erectile dysfunction, unspecified: Secondary | ICD-10-CM | POA: Diagnosis not present

## 2020-10-03 DIAGNOSIS — Z6835 Body mass index (BMI) 35.0-35.9, adult: Secondary | ICD-10-CM | POA: Diagnosis not present

## 2020-10-03 DIAGNOSIS — E669 Obesity, unspecified: Secondary | ICD-10-CM | POA: Diagnosis not present

## 2020-10-03 DIAGNOSIS — Z8249 Family history of ischemic heart disease and other diseases of the circulatory system: Secondary | ICD-10-CM | POA: Diagnosis not present

## 2020-10-03 DIAGNOSIS — R32 Unspecified urinary incontinence: Secondary | ICD-10-CM | POA: Diagnosis not present

## 2020-10-03 DIAGNOSIS — Z833 Family history of diabetes mellitus: Secondary | ICD-10-CM | POA: Diagnosis not present

## 2020-10-04 DIAGNOSIS — R69 Illness, unspecified: Secondary | ICD-10-CM | POA: Diagnosis not present

## 2020-10-08 DIAGNOSIS — I951 Orthostatic hypotension: Secondary | ICD-10-CM | POA: Diagnosis not present

## 2020-10-08 DIAGNOSIS — R0982 Postnasal drip: Secondary | ICD-10-CM | POA: Diagnosis not present

## 2020-10-08 DIAGNOSIS — H8111 Benign paroxysmal vertigo, right ear: Secondary | ICD-10-CM | POA: Diagnosis not present

## 2020-10-18 DIAGNOSIS — E782 Mixed hyperlipidemia: Secondary | ICD-10-CM | POA: Diagnosis not present

## 2020-10-18 DIAGNOSIS — R69 Illness, unspecified: Secondary | ICD-10-CM | POA: Diagnosis not present

## 2020-10-18 DIAGNOSIS — I10 Essential (primary) hypertension: Secondary | ICD-10-CM | POA: Diagnosis not present

## 2020-10-18 DIAGNOSIS — H409 Unspecified glaucoma: Secondary | ICD-10-CM | POA: Diagnosis not present

## 2020-11-01 DIAGNOSIS — R69 Illness, unspecified: Secondary | ICD-10-CM | POA: Diagnosis not present

## 2020-11-11 DIAGNOSIS — Z0000 Encounter for general adult medical examination without abnormal findings: Secondary | ICD-10-CM | POA: Diagnosis not present

## 2020-11-11 DIAGNOSIS — Z1389 Encounter for screening for other disorder: Secondary | ICD-10-CM | POA: Diagnosis not present

## 2020-11-13 DIAGNOSIS — M25551 Pain in right hip: Secondary | ICD-10-CM | POA: Diagnosis not present

## 2020-11-13 DIAGNOSIS — Z125 Encounter for screening for malignant neoplasm of prostate: Secondary | ICD-10-CM | POA: Diagnosis not present

## 2020-11-13 DIAGNOSIS — K6289 Other specified diseases of anus and rectum: Secondary | ICD-10-CM | POA: Diagnosis not present

## 2020-11-13 DIAGNOSIS — N529 Male erectile dysfunction, unspecified: Secondary | ICD-10-CM | POA: Diagnosis not present

## 2020-11-13 DIAGNOSIS — R69 Illness, unspecified: Secondary | ICD-10-CM | POA: Diagnosis not present

## 2020-11-13 DIAGNOSIS — I10 Essential (primary) hypertension: Secondary | ICD-10-CM | POA: Diagnosis not present

## 2020-11-13 DIAGNOSIS — E782 Mixed hyperlipidemia: Secondary | ICD-10-CM | POA: Diagnosis not present

## 2020-11-15 DIAGNOSIS — R69 Illness, unspecified: Secondary | ICD-10-CM | POA: Diagnosis not present

## 2020-11-21 DIAGNOSIS — R69 Illness, unspecified: Secondary | ICD-10-CM | POA: Diagnosis not present

## 2020-11-21 DIAGNOSIS — E782 Mixed hyperlipidemia: Secondary | ICD-10-CM | POA: Diagnosis not present

## 2020-11-21 DIAGNOSIS — H409 Unspecified glaucoma: Secondary | ICD-10-CM | POA: Diagnosis not present

## 2020-11-21 DIAGNOSIS — I10 Essential (primary) hypertension: Secondary | ICD-10-CM | POA: Diagnosis not present

## 2020-12-05 DIAGNOSIS — R945 Abnormal results of liver function studies: Secondary | ICD-10-CM | POA: Diagnosis not present

## 2020-12-13 DIAGNOSIS — R69 Illness, unspecified: Secondary | ICD-10-CM | POA: Diagnosis not present

## 2020-12-20 ENCOUNTER — Telehealth: Payer: Self-pay | Admitting: Cardiology

## 2020-12-20 DIAGNOSIS — R000 Tachycardia, unspecified: Secondary | ICD-10-CM | POA: Diagnosis not present

## 2020-12-20 DIAGNOSIS — R0989 Other specified symptoms and signs involving the circulatory and respiratory systems: Secondary | ICD-10-CM | POA: Diagnosis not present

## 2020-12-20 DIAGNOSIS — I10 Essential (primary) hypertension: Secondary | ICD-10-CM | POA: Diagnosis not present

## 2020-12-20 NOTE — Telephone Encounter (Signed)
Spoke with pt, while working out today his heart rate elevated and it did not recover as fast as in the past he panicked and called 911. They did EKG and vitals and everything was fine. He will make sure he stays hydrated and he has a follow up appointment.

## 2020-12-20 NOTE — Telephone Encounter (Signed)
Patient c/o Palpitations:  High priority if patient c/o lightheadedness, shortness of breath, or chest pain  How long have you had palpitations/irregular HR/ Afib? Are you having the symptoms now?  No  Are you currently experiencing lightheadedness, SOB or CP? No  Do you have a history of afib (atrial fibrillation) or irregular heart rhythm? No  Have you checked your BP or HR? (document readings if available): EMS checked BP 150/90 Pulse 75    45 minutes ago  Are you experiencing any other symptoms? Pt was working out on the [employer] had to call EMT because he felt like he was having irregular hear rate, EMS checked pt out and he is fine. Pt is currently taking his BP 145/74 Pulse 72 beats per minute

## 2020-12-27 DIAGNOSIS — H409 Unspecified glaucoma: Secondary | ICD-10-CM | POA: Diagnosis not present

## 2020-12-27 DIAGNOSIS — I10 Essential (primary) hypertension: Secondary | ICD-10-CM | POA: Diagnosis not present

## 2020-12-27 DIAGNOSIS — E782 Mixed hyperlipidemia: Secondary | ICD-10-CM | POA: Diagnosis not present

## 2020-12-27 DIAGNOSIS — R69 Illness, unspecified: Secondary | ICD-10-CM | POA: Diagnosis not present

## 2021-01-09 ENCOUNTER — Encounter (HOSPITAL_BASED_OUTPATIENT_CLINIC_OR_DEPARTMENT_OTHER): Payer: Self-pay | Admitting: Cardiology

## 2021-01-09 ENCOUNTER — Other Ambulatory Visit: Payer: Self-pay

## 2021-01-09 ENCOUNTER — Ambulatory Visit (HOSPITAL_BASED_OUTPATIENT_CLINIC_OR_DEPARTMENT_OTHER): Payer: Medicare HMO | Admitting: Cardiology

## 2021-01-09 VITALS — BP 130/77 | HR 67 | Ht 70.0 in | Wt 233.8 lb

## 2021-01-09 DIAGNOSIS — I10 Essential (primary) hypertension: Secondary | ICD-10-CM | POA: Diagnosis not present

## 2021-01-09 DIAGNOSIS — R000 Tachycardia, unspecified: Secondary | ICD-10-CM | POA: Diagnosis not present

## 2021-01-09 DIAGNOSIS — H548 Legal blindness, as defined in USA: Secondary | ICD-10-CM

## 2021-01-09 DIAGNOSIS — E7800 Pure hypercholesterolemia, unspecified: Secondary | ICD-10-CM | POA: Diagnosis not present

## 2021-01-09 NOTE — Progress Notes (Signed)
Cardiology Office Note:    Date:  01/09/2021   ID:  Amalia Hailey, DOB 01/09/1961, MRN [id number]  PCP:  Marda Stalker, PA-C  Cardiologist:  Buford Dresser, MD PhD  Referring MD: Marda Stalker, PA-C   CC: follow up  History of Present Illness:    Gates Jividen is a 60 y.o. male with a hx of hypertension, hyperlipidemia, legal blindness who is seen for follow up of chest pain. I met him initially in the hospital for new consultation on 03/06/18. He had a nuclear stress test at that time. He followed up with Jory Sims on 03/21/18.  Today: Typically, he works out on a rowing machine 4-5 days a week. He checks his pulse occasionally, fearful of it rising too high during exercise. He noticed he was in tachycardia and grew anxious. His heart rate remained at 130 bpm for a while, and he called the ED. At one point his heart rate dropped to 120, but rose to 130 again shortly. It had been 45 minutes by the time the paramedic confirmed a heart rate of 130. Five minutes afterwards his heart rate dropped to 75 bpm. This event prompted his visit today.  On 8/9, he had a near-syncopal episode after finishing 3 sets of 40 pushups. He lied down, and sudden back pain prevented him from rising for a while. When he was able, he stood still in the shower running hot water. He then became lightheaded and felt near-syncopal. After resting he felt better and did not completely pass out.  At this time he is not taking any cholesterol medication. Previously he was taking red yeast rice. He has discontinued this and is now trying bergamot as a supplement.  At night he will develop minor LE edema as a side effect of amlodipine.  He endorses tremors in his upper extremities. He denies any chest pain, or shortness of breath. No headaches, orthopnea, or PND.   Past Medical History:  Diagnosis Date   Asthma     Past Surgical History:  Procedure Laterality Date   EYE SURGERY     Legally Blind     Current Medications: Current Outpatient Medications on File Prior to Visit  Medication Sig   ALPRAZolam (XANAX) 0.5 MG tablet Take 0.5 mg by mouth daily as needed.   amLODipine (NORVASC) 10 MG tablet TAKE ONE TABLET BY MOUTH DAILY   brimonidine (ALPHAGAN) 0.2 % ophthalmic solution Place 1 drop into both eyes daily.    dorzolamide (TRUSOPT) 2 % ophthalmic solution Place 1 drop into both eyes 2 (two) times daily.   dorzolamide-timolol (COSOPT) 22.3-6.8 MG/ML ophthalmic solution    latanoprost (XALATAN) 0.005 % ophthalmic solution Apply 1 drop to eye at bedtime.   methocarbamol (ROBAXIN) 500 MG tablet Take 1 tablet (500 mg total) by mouth at bedtime as needed for muscle spasms.   Omega-3 Fatty Acids (OMEGA 3 PO) Take 2 capsules by mouth 2 (two) times daily.   timolol (BETIMOL) 0.25 % ophthalmic solution Place 1 drop into both eyes at bedtime.   timolol (TIMOPTIC) 0.5 % ophthalmic solution Place 1 drop into both eyes 2 (two) times daily.   No current facility-administered medications on file prior to visit.     Allergies:   Codeine   Social History   Tobacco Use   Smoking status: Never   Smokeless tobacco: Never  Vaping Use   Vaping Use: Never used  Substance Use Topics   Alcohol use: Never   Drug use: Never  Family History: The patient's family history includes Breast cancer in his sister; CAD in his father; Hypertension in his brother and father; Liver disease in his mother.  ROS:   Please see the history of present illness. (+) Tachycardia, 130 bpm for 45 min (+) Anxiety (+) Bilateral UE tremors (+) Lightheadedness (+) Near-syncope All other systems are reviewed and negative.    EKGs/Labs/Other Studies Reviewed:    The following studies were reviewed today:  NM Stress Test 03/07/2018 Blood pressure demonstrated a hypertensive response to exercise. Downsloping ST segment depression ST segment depression of 1 mm was noted during stress in the V5 and V6  leads. Findings consistent with ischemia. This is a low risk study. The left ventricular ejection fraction is mildly decreased (45-54%).   SDS 5 with mild ischemia in the true apex as well as mid and apical septum Note there is some soft tissue attenuation In this are on RAW images. Baseline ECG abnormal but concern for 1 mm ST depression in inferior and anterolateral leads With T wave inversions V45 with stress and recovery EF estimated at 50% but looks normal    Echocardiogram 03/06/2018 Procedure narrative: Transthoracic echocardiography. Image   quality was suboptimal. The study was technically difficult, as a   result of poor acoustic windows, poor sound wave transmission,   and body habitus. Intravenous contrast (Definity) was   administered. - Left ventricle: Poorly visualized. The cavity size was normal.   Systolic function was vigorous. The estimated ejection fraction   was in the range of 65% to 70%. Wall motion was normal; there   were no regional wall motion abnormalities. Left ventricular   diastolic function parameters were normal.  EKG:  EKG is personally reviewed.   01/09/2021: NSR at 67 bpm, nonspecific T wave pattern similar to prior 03/31/2019: NSR, nonspecific TW changes similar to prior, single PVC  Recent Labs: No results found for requested labs within last 8760 hours.  Recent Lipid Panel    Component Value Date/Time   CHOL 206 (H) 03/06/2018 0450   TRIG 130 03/06/2018 0450   HDL 40 (L) 03/06/2018 0450   CHOLHDL 5.2 03/06/2018 0450   VLDL 26 03/06/2018 0450   LDLCALC 140 (H) 03/06/2018 0450    Physical Exam:    VS:  BP 130/77   Pulse 67   Ht $R'5\' 10"'RH$  (1.778 m)   Wt 233 lb 12.8 oz (106.1 kg)   SpO2 95%   BMI 33.55 kg/m     Wt Readings from Last 3 Encounters:  01/09/21 233 lb 12.8 oz (106.1 kg)  09/20/19 210 lb (95.3 kg)  03/31/19 220 lb (99.8 kg)    GEN: Well nourished, well developed in no acute distress HEENT: Normal, moist mucous  membranes NECK: No JVD CARDIAC: regular rhythm, normal S1 and S2, no rubs or gallops. No murmur. VASCULAR: Radial and DP pulses 2+ bilaterally. No carotid bruits RESPIRATORY:  Clear to auscultation without rales, wheezing or rhonchi  ABDOMEN: Soft, non-tender, non-distended MUSCULOSKELETAL:  Ambulates independently SKIN: Warm and dry, no edema NEUROLOGIC:  Alert and oriented x 3. No focal neuro deficits noted. PSYCHIATRIC:  Normal affect    ASSESSMENT:    1. Tachycardia   2. Essential hypertension   3. Hypercholesterolemia   4. Legally blind     PLAN:    Tachycardia: -reviewed actual strips from EMS, SR at 75 bpm. -ECG today NSR -discussed sinus tachycardia and tachyarrhythmias today. Used KardiaMobile in clinic today. He feels he can navigate this with  his blindness. Instructed on use -if becomes frequent, would consider an event monitor. -discussed red flag warning signs that need immediate medical attention  Hypertension: at goal today -continue amlodipine 10 mg daily  Hyperlipidemia:  -see prior discussions, respect his autonomy, he prefers to try natural treatments  CV risk and prevention counseling: -recommend heart healthy/Mediterranean diet, with whole grains, fruits, vegetable, fish, lean meats, nuts, and olive oil. Limit salt. -recommend moderate walking, 3-5 times/week for 30-50 minutes each session. Aim for at least 150 minutes.week. Goal should be pace of 3 miles/hours, or walking 1.5 miles in 30 minutes -recommend avoidance of tobacco products. Avoid excess alcohol. -ASCVD risk score: The 10-year ASCVD risk score Mikey Bussing DC Brooke Bonito., et al., 2013) is: 12.4%   Values used to calculate the score:     Age: 6 years     Sex: Male     Is Non-Hispanic African American: No     Diabetic: No     Tobacco smoker: No     Systolic Blood Pressure: 623 mmHg     Is BP treated: Yes     HDL Cholesterol: 40 mg/dL     Total Cholesterol: 206 mg/dL   Plan for follow up: I would be  happy to see him back as needed  Buford Dresser, MD, PhD [place]  [facility]   Medication Adjustments/Labs and Tests Ordered: Current medicines are reviewed at length with the patient today.  Concerns regarding medicines are outlined above.   Orders Placed This Encounter  Procedures   EKG 12-Lead    No orders of the defined types were placed in this encounter.  Patient Instructions  Medication Instructions:  Your Physician recommend you continue on your current medication as directed.    *If you need a refill on your cardiac medications before your next appointment, please call your pharmacy*   Lab Work: None ordered today   Testing/Procedures: None ordered today   Follow-Up: At [facility], you and your health needs are our priority.  As part of our continuing mission to provide you with exceptional heart care, we have created designated Provider Care Teams.  These Care Teams include your primary Cardiologist (physician) and Advanced Practice Providers (APPs -  Physician Assistants and Nurse Practitioners) who all work together to provide you with the care you need, when you need it.  We recommend signing up for the patient portal called "MyChart".  Sign up information is provided on this After Visit Summary.  MyChart is used to connect with patients for Virtual Visits (Telemedicine).  Patients are able to view lab/test results, encounter notes, upcoming appointments, etc.  Non-urgent messages can be sent to your provider as well.   To learn more about what you can do with MyChart, go to [email or web address].    Your next appointment:   As needed  The format for your next appointment:   In Person  Provider:   Buford Dresser, MD   Other Instructions Look into a KardiaMobile to track your episodes of fast heart rate.   Kardia Mobile [employer]: Website: [email or web address]  DR. Harrell Gave RECOMMENDS YOU PURCHASE  " Kardia" By  [employer]  [employer] FROM THE  GOOGLE/[employer]  APP PLAY STORE.  THE APP IS FREE , BUT THE  EQUIPMENT HAS A COST. IT ALLOWS YOU TO OBTAIN A RECORDING OF YOUR HEART RATE AND RHYTHM BY PROVIDING A SHORT STRIP THAT YOU CAN SHARE WITH YOUR PROVIDER.     I,Mathew Stumpf,acting as a Education administrator for [employer], MD.,have documented  all relevant documentation on the behalf of Buford Dresser, MD,as directed by  Buford Dresser, MD while in the presence of Buford Dresser, MD.  I, Buford Dresser, MD, have reviewed all documentation for this visit. The documentation on 01/09/21 for the exam, diagnosis, procedures, and orders are all accurate and complete.   Signed, Buford Dresser, MD PhD 01/09/2021 1:11 PM    [place]

## 2021-01-09 NOTE — Patient Instructions (Addendum)
Medication Instructions:  Your Physician recommend you continue on your current medication as directed.    *If you need a refill on your cardiac medications before your next appointment, please call your pharmacy*   Lab Work: None ordered today   Testing/Procedures: None ordered today   Follow-Up: At [facility], you and your health needs are our priority.  As part of our continuing mission to provide you with exceptional heart care, we have created designated Provider Care Teams.  These Care Teams include your primary Cardiologist (physician) and Advanced Practice Providers (APPs -  Physician Assistants and Nurse Practitioners) who all work together to provide you with the care you need, when you need it.  We recommend signing up for the patient portal called "MyChart".  Sign up information is provided on this After Visit Summary.  MyChart is used to connect with patients for Virtual Visits (Telemedicine).  Patients are able to view lab/test results, encounter notes, upcoming appointments, etc.  Non-urgent messages can be sent to your provider as well.   To learn more about what you can do with MyChart, go to [email or web address].    Your next appointment:   As needed  The format for your next appointment:   In Person  Provider:   Buford Dresser, MD   Other Instructions Look into a KardiaMobile to track your episodes of fast heart rate.   Kardia Mobile [employer]: Website: [email or web address]  DR. Harrell Gave RECOMMENDS YOU PURCHASE  " Kardia" By [employer]  INC. FROM THE  GOOGLE/[employer]  APP PLAY STORE.  THE APP IS FREE , BUT THE  EQUIPMENT HAS A COST. IT ALLOWS YOU TO OBTAIN A RECORDING OF YOUR HEART RATE AND RHYTHM BY PROVIDING A SHORT STRIP THAT YOU CAN SHARE WITH YOUR PROVIDER.

## 2021-01-17 DIAGNOSIS — F4322 Adjustment disorder with anxiety: Secondary | ICD-10-CM | POA: Diagnosis not present

## 2021-01-17 DIAGNOSIS — R69 Illness, unspecified: Secondary | ICD-10-CM | POA: Diagnosis not present

## 2021-01-23 DIAGNOSIS — I10 Essential (primary) hypertension: Secondary | ICD-10-CM | POA: Diagnosis not present

## 2021-01-23 DIAGNOSIS — E782 Mixed hyperlipidemia: Secondary | ICD-10-CM | POA: Diagnosis not present

## 2021-01-23 DIAGNOSIS — R69 Illness, unspecified: Secondary | ICD-10-CM | POA: Diagnosis not present

## 2021-01-23 DIAGNOSIS — H409 Unspecified glaucoma: Secondary | ICD-10-CM | POA: Diagnosis not present

## 2021-01-31 DIAGNOSIS — F4322 Adjustment disorder with anxiety: Secondary | ICD-10-CM | POA: Diagnosis not present

## 2021-01-31 DIAGNOSIS — R69 Illness, unspecified: Secondary | ICD-10-CM | POA: Diagnosis not present

## 2021-02-14 DIAGNOSIS — F4322 Adjustment disorder with anxiety: Secondary | ICD-10-CM | POA: Diagnosis not present

## 2021-02-14 DIAGNOSIS — R69 Illness, unspecified: Secondary | ICD-10-CM | POA: Diagnosis not present

## 2021-02-27 DIAGNOSIS — R69 Illness, unspecified: Secondary | ICD-10-CM | POA: Diagnosis not present

## 2021-02-27 DIAGNOSIS — I10 Essential (primary) hypertension: Secondary | ICD-10-CM | POA: Diagnosis not present

## 2021-02-27 DIAGNOSIS — H409 Unspecified glaucoma: Secondary | ICD-10-CM | POA: Diagnosis not present

## 2021-02-27 DIAGNOSIS — E782 Mixed hyperlipidemia: Secondary | ICD-10-CM | POA: Diagnosis not present

## 2021-02-28 DIAGNOSIS — R69 Illness, unspecified: Secondary | ICD-10-CM | POA: Diagnosis not present

## 2021-02-28 DIAGNOSIS — F4322 Adjustment disorder with anxiety: Secondary | ICD-10-CM | POA: Diagnosis not present

## 2021-03-14 ENCOUNTER — Other Ambulatory Visit (HOSPITAL_BASED_OUTPATIENT_CLINIC_OR_DEPARTMENT_OTHER): Payer: Self-pay | Admitting: Cardiology

## 2021-03-14 DIAGNOSIS — F4322 Adjustment disorder with anxiety: Secondary | ICD-10-CM | POA: Diagnosis not present

## 2021-03-14 DIAGNOSIS — R69 Illness, unspecified: Secondary | ICD-10-CM | POA: Diagnosis not present

## 2021-03-14 DIAGNOSIS — I10 Essential (primary) hypertension: Secondary | ICD-10-CM

## 2021-03-14 NOTE — Telephone Encounter (Signed)
Rx(s) sent to pharmacy electronically.  

## 2021-03-19 DIAGNOSIS — I10 Essential (primary) hypertension: Secondary | ICD-10-CM | POA: Diagnosis not present

## 2021-03-19 DIAGNOSIS — R69 Illness, unspecified: Secondary | ICD-10-CM | POA: Diagnosis not present

## 2021-03-19 DIAGNOSIS — E782 Mixed hyperlipidemia: Secondary | ICD-10-CM | POA: Diagnosis not present

## 2021-03-19 DIAGNOSIS — H409 Unspecified glaucoma: Secondary | ICD-10-CM | POA: Diagnosis not present

## 2021-04-11 DIAGNOSIS — F4322 Adjustment disorder with anxiety: Secondary | ICD-10-CM | POA: Diagnosis not present

## 2021-04-11 DIAGNOSIS — R69 Illness, unspecified: Secondary | ICD-10-CM | POA: Diagnosis not present

## 2021-04-22 DIAGNOSIS — H409 Unspecified glaucoma: Secondary | ICD-10-CM | POA: Diagnosis not present

## 2021-04-22 DIAGNOSIS — R69 Illness, unspecified: Secondary | ICD-10-CM | POA: Diagnosis not present

## 2021-04-22 DIAGNOSIS — E782 Mixed hyperlipidemia: Secondary | ICD-10-CM | POA: Diagnosis not present

## 2021-04-22 DIAGNOSIS — I10 Essential (primary) hypertension: Secondary | ICD-10-CM | POA: Diagnosis not present

## 2021-05-16 DIAGNOSIS — F4322 Adjustment disorder with anxiety: Secondary | ICD-10-CM | POA: Diagnosis not present

## 2021-05-16 DIAGNOSIS — R69 Illness, unspecified: Secondary | ICD-10-CM | POA: Diagnosis not present

## 2021-05-21 DIAGNOSIS — Z86010 Personal history of colonic polyps: Secondary | ICD-10-CM | POA: Diagnosis not present

## 2021-05-21 DIAGNOSIS — D125 Benign neoplasm of sigmoid colon: Secondary | ICD-10-CM | POA: Diagnosis not present

## 2021-05-21 DIAGNOSIS — Z8371 Family history of colonic polyps: Secondary | ICD-10-CM | POA: Diagnosis not present

## 2021-05-21 DIAGNOSIS — D123 Benign neoplasm of transverse colon: Secondary | ICD-10-CM | POA: Diagnosis not present

## 2021-05-21 DIAGNOSIS — K5730 Diverticulosis of large intestine without perforation or abscess without bleeding: Secondary | ICD-10-CM | POA: Diagnosis not present

## 2021-05-21 DIAGNOSIS — D122 Benign neoplasm of ascending colon: Secondary | ICD-10-CM | POA: Diagnosis not present

## 2021-05-21 DIAGNOSIS — K649 Unspecified hemorrhoids: Secondary | ICD-10-CM | POA: Diagnosis not present

## 2021-05-27 DIAGNOSIS — D123 Benign neoplasm of transverse colon: Secondary | ICD-10-CM | POA: Diagnosis not present

## 2021-05-27 DIAGNOSIS — D125 Benign neoplasm of sigmoid colon: Secondary | ICD-10-CM | POA: Diagnosis not present

## 2021-05-27 DIAGNOSIS — D122 Benign neoplasm of ascending colon: Secondary | ICD-10-CM | POA: Diagnosis not present

## 2021-06-27 ENCOUNTER — Encounter (HOSPITAL_BASED_OUTPATIENT_CLINIC_OR_DEPARTMENT_OTHER): Payer: Self-pay

## 2021-06-27 ENCOUNTER — Emergency Department (HOSPITAL_BASED_OUTPATIENT_CLINIC_OR_DEPARTMENT_OTHER)
Admission: EM | Admit: 2021-06-27 | Discharge: 2021-06-27 | Disposition: A | Discharge status: Home / Self Care | Payer: Medicare HMO | Attending: Emergency Medicine | Admitting: Emergency Medicine

## 2021-06-27 ENCOUNTER — Other Ambulatory Visit: Payer: Self-pay

## 2021-06-27 DIAGNOSIS — J45909 Unspecified asthma, uncomplicated: Secondary | ICD-10-CM | POA: Diagnosis not present

## 2021-06-27 DIAGNOSIS — R69 Illness, unspecified: Secondary | ICD-10-CM | POA: Diagnosis not present

## 2021-06-27 DIAGNOSIS — F4322 Adjustment disorder with anxiety: Secondary | ICD-10-CM | POA: Diagnosis not present

## 2021-06-27 DIAGNOSIS — X102XXA Contact with fats and cooking oils, initial encounter: Secondary | ICD-10-CM | POA: Insufficient documentation

## 2021-06-27 DIAGNOSIS — T310 Burns involving less than 10% of body surface: Secondary | ICD-10-CM | POA: Diagnosis not present

## 2021-06-27 DIAGNOSIS — T22011A Burn of unspecified degree of right forearm, initial encounter: Secondary | ICD-10-CM | POA: Diagnosis not present

## 2021-06-27 DIAGNOSIS — T22111A Burn of first degree of right forearm, initial encounter: Secondary | ICD-10-CM | POA: Diagnosis not present

## 2021-06-27 DIAGNOSIS — T22211A Burn of second degree of right forearm, initial encounter: Secondary | ICD-10-CM | POA: Diagnosis not present

## 2021-06-27 MED ORDER — SILVER SULFADIAZINE 1 % EX CREA: 1.0000 "application " | TOPICAL_CREAM | Freq: Every day | CUTANEOUS | 0 refills | Status: AC
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
## 2021-07-25 DIAGNOSIS — R69 Illness, unspecified: Secondary | ICD-10-CM | POA: Diagnosis not present

## 2021-07-25 DIAGNOSIS — F4322 Adjustment disorder with anxiety: Secondary | ICD-10-CM | POA: Diagnosis not present

## 2021-08-15 DIAGNOSIS — R69 Illness, unspecified: Secondary | ICD-10-CM | POA: Diagnosis not present

## 2021-08-15 DIAGNOSIS — F4322 Adjustment disorder with anxiety: Secondary | ICD-10-CM | POA: Diagnosis not present

## 2021-08-29 DIAGNOSIS — F4322 Adjustment disorder with anxiety: Secondary | ICD-10-CM | POA: Diagnosis not present

## 2021-08-29 DIAGNOSIS — R69 Illness, unspecified: Secondary | ICD-10-CM | POA: Diagnosis not present

## 2021-09-08 DIAGNOSIS — I10 Essential (primary) hypertension: Secondary | ICD-10-CM | POA: Diagnosis not present

## 2021-09-08 DIAGNOSIS — R69 Illness, unspecified: Secondary | ICD-10-CM | POA: Diagnosis not present

## 2021-09-08 DIAGNOSIS — E782 Mixed hyperlipidemia: Secondary | ICD-10-CM | POA: Diagnosis not present

## 2021-09-12 DIAGNOSIS — R69 Illness, unspecified: Secondary | ICD-10-CM | POA: Diagnosis not present

## 2021-09-12 DIAGNOSIS — F4322 Adjustment disorder with anxiety: Secondary | ICD-10-CM | POA: Diagnosis not present

## 2021-10-10 DIAGNOSIS — R69 Illness, unspecified: Secondary | ICD-10-CM | POA: Diagnosis not present

## 2021-10-10 DIAGNOSIS — F4322 Adjustment disorder with anxiety: Secondary | ICD-10-CM | POA: Diagnosis not present

## 2021-10-31 DIAGNOSIS — R69 Illness, unspecified: Secondary | ICD-10-CM | POA: Diagnosis not present

## 2021-10-31 DIAGNOSIS — F4322 Adjustment disorder with anxiety: Secondary | ICD-10-CM | POA: Diagnosis not present

## 2021-11-21 DIAGNOSIS — F4322 Adjustment disorder with anxiety: Secondary | ICD-10-CM | POA: Diagnosis not present

## 2021-11-21 DIAGNOSIS — R69 Illness, unspecified: Secondary | ICD-10-CM | POA: Diagnosis not present

## 2021-12-19 DIAGNOSIS — R69 Illness, unspecified: Secondary | ICD-10-CM | POA: Diagnosis not present

## 2021-12-19 DIAGNOSIS — F4322 Adjustment disorder with anxiety: Secondary | ICD-10-CM | POA: Diagnosis not present

## 2022-01-16 DIAGNOSIS — F4322 Adjustment disorder with anxiety: Secondary | ICD-10-CM | POA: Diagnosis not present

## 2022-01-16 DIAGNOSIS — R69 Illness, unspecified: Secondary | ICD-10-CM | POA: Diagnosis not present

## 2022-01-30 DIAGNOSIS — R69 Illness, unspecified: Secondary | ICD-10-CM | POA: Diagnosis not present

## 2022-01-30 DIAGNOSIS — F4322 Adjustment disorder with anxiety: Secondary | ICD-10-CM | POA: Diagnosis not present

## 2022-02-13 DIAGNOSIS — R69 Illness, unspecified: Secondary | ICD-10-CM | POA: Diagnosis not present

## 2022-02-13 DIAGNOSIS — F4322 Adjustment disorder with anxiety: Secondary | ICD-10-CM | POA: Diagnosis not present

## 2022-03-02 DIAGNOSIS — H4423 Degenerative myopia, bilateral: Secondary | ICD-10-CM | POA: Diagnosis not present

## 2022-03-24 ENCOUNTER — Telehealth (HOSPITAL_BASED_OUTPATIENT_CLINIC_OR_DEPARTMENT_OTHER): Payer: Self-pay | Admitting: Cardiology

## 2022-03-24 NOTE — Telephone Encounter (Signed)
Spoke with patient and his blood pressure has been running 110's-120's/60's today DBP down to 58  He just wanted to make sure blood pressure not getting too low since he googled it findings concerned him.  Patient stated he feels fine and continues to work out [employer] Amlodipine 10 mg daily and Sildenafil 20 mg as needed (almost daily)  Offered follow up appointment however he just wanted for Dr Harrell Gave to review and make sure everything ok with current readings.

## 2022-03-24 NOTE — Telephone Encounter (Signed)
Left message to call back to discuss.

## 2022-03-24 NOTE — Telephone Encounter (Signed)
Pt c/o BP issue: STAT if pt c/o blurred vision, one-sided weakness or slurred speech  1. What are your last 5 BP readings?  Yes, but not available at this time  2. Are you having any other symptoms (ex. Dizziness, headache, blurred vision, passed out)?   No  3. What is your BP issue?   Patient stated his diastolic readings has been running low.  Patient would like to know if he should be concerned when his BP is 778-242 and his diastolic is 60.  Patient stated message can be left in VM.

## 2022-03-26 NOTE — Telephone Encounter (Signed)
So long as no lightheadedness, dizziness BP of 110s-120s/60s are fine. Newest guidelines from AHA/ACC say the lower the BP the better as it puts less strain on the heart. If he notes lightheadedness or dizziness would recommend he call our office.   Loel Dubonnet, NP

## 2022-03-27 DIAGNOSIS — F4322 Adjustment disorder with anxiety: Secondary | ICD-10-CM | POA: Diagnosis not present

## 2022-03-27 DIAGNOSIS — R69 Illness, unspecified: Secondary | ICD-10-CM | POA: Diagnosis not present

## 2022-03-30 NOTE — Telephone Encounter (Signed)
Pt updated and verbalized understanding.  

## 2022-03-30 NOTE — Telephone Encounter (Signed)
Left message to call back  

## 2022-04-10 DIAGNOSIS — F4322 Adjustment disorder with anxiety: Secondary | ICD-10-CM | POA: Diagnosis not present

## 2022-04-10 DIAGNOSIS — R69 Illness, unspecified: Secondary | ICD-10-CM | POA: Diagnosis not present

## 2022-05-22 DIAGNOSIS — F4322 Adjustment disorder with anxiety: Secondary | ICD-10-CM | POA: Diagnosis not present

## 2022-05-22 DIAGNOSIS — R69 Illness, unspecified: Secondary | ICD-10-CM | POA: Diagnosis not present

## 2022-06-05 DIAGNOSIS — F4322 Adjustment disorder with anxiety: Secondary | ICD-10-CM | POA: Diagnosis not present

## 2022-06-05 DIAGNOSIS — R69 Illness, unspecified: Secondary | ICD-10-CM | POA: Diagnosis not present

## 2022-06-24 ENCOUNTER — Emergency Department (HOSPITAL_BASED_OUTPATIENT_CLINIC_OR_DEPARTMENT_OTHER)
Admission: EM | Admit: 2022-06-24 | Discharge: 2022-06-24 | Disposition: A | Discharge status: Home / Self Care | Payer: Medicare HMO | Attending: Emergency Medicine | Admitting: Emergency Medicine

## 2022-06-24 ENCOUNTER — Encounter (HOSPITAL_BASED_OUTPATIENT_CLINIC_OR_DEPARTMENT_OTHER): Payer: Self-pay

## 2022-06-24 DIAGNOSIS — R21 Rash and other nonspecific skin eruption: Secondary | ICD-10-CM | POA: Diagnosis present

## 2022-06-24 DIAGNOSIS — B029 Zoster without complications: Secondary | ICD-10-CM | POA: Diagnosis not present

## 2022-06-24 MED ORDER — ACYCLOVIR 200 MG PO CAPS
800.0000 mg | ORAL_CAPSULE | Freq: Once | ORAL | Status: AC
  Administered 2022-06-24: 800 mg via ORAL
  Filled 2022-06-24: qty 4

## 2022-06-24 MED ORDER — IBUPROFEN 400 MG PO TABS
600.0000 mg | ORAL_TABLET | Freq: Once | ORAL | Status: AC
  Administered 2022-06-24: 600 mg via ORAL
  Filled 2022-06-24: qty 1

## 2022-06-24 MED ORDER — ACYCLOVIR 400 MG PO TABS: 400.0000 mg | ORAL_TABLET | Freq: Four times a day (QID) | ORAL | 0 refills | Status: AC

## 2022-06-24 NOTE — ED Notes (Signed)
Discharge paperwork reviewed entirely with patient, including Rx's and follow up care. Pain was under control. Pt verbalized understanding as well as all parties involved. No questions or concerns voiced at the time of discharge. No acute distress noted.   Patient was assisted out by his father due to being blind.

## 2022-06-24 NOTE — ED Provider Notes (Signed)
[facility] [place] [facility] Provider Note   CSN: [id number] Arrival date & time: 06/24/22  1034     History  Chief Complaint  Patient presents with   Rash   HPI John Bell is a 62 y.o. male presenting for rash.  Started on Monday.  Located in his mid back beginning just laterally to the right side of the spine.  Other smaller lesions that are similar and more scant in a bandlike fashion that extend around to the front of his abdomen in a bandlike fashion.  The rash was itchy and burning.   Rash      Home Medications Prior to Admission medications   Medication Sig Start Date End Date Taking? Authorizing Provider  acyclovir (ZOVIRAX) 400 MG tablet Take 1 tablet (400 mg total) by mouth 4 (four) times daily. 06/24/22  Yes Harriet Pho, PA-C  ALPRAZolam Duanne Moron) 0.5 MG tablet Take 0.5 mg by mouth daily as needed. 07/26/19   [provider]  amLODipine (NORVASC) 10 MG tablet TAKE ONE TABLET BY MOUTH ONCE DAILY 03/14/21   Buford Dresser, MD  brimonidine (ALPHAGAN) 0.2 % ophthalmic solution Place 1 drop into both eyes daily.  10/12/14   [provider]  dorzolamide (TRUSOPT) 2 % ophthalmic solution Place 1 drop into both eyes 2 (two) times daily. 03/26/15   [provider]  dorzolamide-timolol (COSOPT) 22.3-6.8 MG/ML ophthalmic solution  08/30/19   [provider]  latanoprost (XALATAN) 0.005 % ophthalmic solution Apply 1 drop to eye at bedtime. 09/30/16   [provider]  methocarbamol (ROBAXIN) 500 MG tablet Take 1 tablet (500 mg total) by mouth at bedtime as needed for muscle spasms. 09/20/19   Caccavale, Sophia, PA-C  Omega-3 Fatty Acids (OMEGA 3 PO) Take 2 capsules by mouth 2 (two) times daily.    [provider]  silver sulfADIAZINE (SILVADENE) 1 % cream Apply 1 application topically daily. 06/27/21   Deatra Canter, Amjad, PA-C  timolol (BETIMOL) 0.25 % ophthalmic solution Place 1 drop into both eyes at  bedtime.    [provider]  timolol (TIMOPTIC) 0.5 % ophthalmic solution Place 1 drop into both eyes 2 (two) times daily. 03/29/14   [provider]      Allergies    Codeine    Review of Systems   Review of Systems  Skin:  Positive for rash.    Physical Exam   Vitals:   06/24/22 1053  BP: (!) 147/90  Pulse: 90  Resp: 18  Temp: 98.2 F (36.8 C)  SpO2: 97%    CONSTITUTIONAL:  well-appearing, NAD NEURO:  Alert and oriented x 3, CN 3-12 grossly intact EYES:  eyes equal and reactive ENT/NECK:  Supple, no stridor  CARDIO:  appears well-perfused PULM:  No respiratory distress GI/GU:  non-distended, soft MSK/SPINE:  No gross deformities, no edema, moves all extremities  SKIN:  atraumatic, erythematous rash with irregular border noted the lateral aspect of the spine about the mid back on the right side port.  Borders is regular.  Other smaller lesions scantly in bandlike spanning to the front of the abdomen.  Rash does not cross midline.   *Additional and/or pertinent findings included in MDM below    ED Results / Procedures / Treatments   Labs (all labs ordered are listed, but only abnormal results are displayed) Labs Reviewed - No data to display  EKG None  Radiology No results found.  Procedures Procedures    Medications Ordered in ED  Medications  ibuprofen (ADVIL) tablet 600 mg (has no administration in time range)  acyclovir (ZOVIRAX) 200 MG capsule 800 mg (has no administration in time range)    ED Course/ Medical Decision Making/ A&P                             Medical Decision Making  62 year old male presenting for rash. Physical exam revealed a bandlike rash did not cross the midline. Differential diagnosis for this, includes contact dermatitis, herpes zoster, and eczema.  Given rash was in a bandlike fashion feels like burning and itching red with irregular border is likely shingles rash.  Treated pain with ibuprofen.  Treated for  herpes zoster with acyclovir.  Sent acyclovir to his pharmacy.  Advise follow-up with his PCP if symptoms persisted.        Final Clinical Impression(s) / ED Diagnoses Final diagnoses:  Herpes zoster without complication    Rx / DC Orders ED Discharge Orders          Ordered    acyclovir (ZOVIRAX) 400 MG tablet  4 times daily        06/24/22 1524              Harriet Pho, PA-C 06/24/22 [place], [place], DO 06/24/22 1548

## 2022-06-24 NOTE — Discharge Instructions (Signed)
Evaluation of a rash today revealed that you likely have shingles.  Will treat with acyclovir which is an antiviral.  If your symptoms persist please follow-up with your PCP.

## 2022-06-24 NOTE — ED Triage Notes (Addendum)
C/o rash, painful to right side of back. Thinks it may be shingles

## 2022-06-29 DIAGNOSIS — Z6833 Body mass index (BMI) 33.0-33.9, adult: Secondary | ICD-10-CM | POA: Diagnosis not present

## 2022-06-29 DIAGNOSIS — N529 Male erectile dysfunction, unspecified: Secondary | ICD-10-CM | POA: Diagnosis not present

## 2022-06-29 DIAGNOSIS — B0223 Postherpetic polyneuropathy: Secondary | ICD-10-CM | POA: Diagnosis not present

## 2022-06-29 DIAGNOSIS — Z125 Encounter for screening for malignant neoplasm of prostate: Secondary | ICD-10-CM | POA: Diagnosis not present

## 2022-06-29 DIAGNOSIS — E782 Mixed hyperlipidemia: Secondary | ICD-10-CM | POA: Diagnosis not present

## 2022-06-29 DIAGNOSIS — R69 Illness, unspecified: Secondary | ICD-10-CM | POA: Diagnosis not present

## 2022-07-10 DIAGNOSIS — R69 Illness, unspecified: Secondary | ICD-10-CM | POA: Diagnosis not present

## 2022-07-10 DIAGNOSIS — F4322 Adjustment disorder with anxiety: Secondary | ICD-10-CM | POA: Diagnosis not present

## 2022-08-07 DIAGNOSIS — F4322 Adjustment disorder with anxiety: Secondary | ICD-10-CM | POA: Diagnosis not present

## 2022-08-07 DIAGNOSIS — R69 Illness, unspecified: Secondary | ICD-10-CM | POA: Diagnosis not present

## 2022-09-03 ENCOUNTER — Encounter (HOSPITAL_BASED_OUTPATIENT_CLINIC_OR_DEPARTMENT_OTHER): Payer: Self-pay | Admitting: *Deleted

## 2022-09-03 ENCOUNTER — Emergency Department (HOSPITAL_BASED_OUTPATIENT_CLINIC_OR_DEPARTMENT_OTHER)
Admission: EM | Admit: 2022-09-03 | Discharge: 2022-09-03 | Disposition: A | Discharge status: Home / Self Care | Payer: Medicare HMO | Attending: Emergency Medicine | Admitting: Emergency Medicine

## 2022-09-03 ENCOUNTER — Other Ambulatory Visit: Payer: Self-pay

## 2022-09-03 DIAGNOSIS — W260XXA Contact with knife, initial encounter: Secondary | ICD-10-CM | POA: Insufficient documentation

## 2022-09-03 DIAGNOSIS — S6991XA Unspecified injury of right wrist, hand and finger(s), initial encounter: Secondary | ICD-10-CM | POA: Diagnosis not present

## 2022-09-03 DIAGNOSIS — Y92000 Kitchen of unspecified non-institutional (private) residence as  the place of occurrence of the external cause: Secondary | ICD-10-CM | POA: Diagnosis not present

## 2022-09-03 DIAGNOSIS — S61212A Laceration without foreign body of right middle finger without damage to nail, initial encounter: Secondary | ICD-10-CM | POA: Insufficient documentation

## 2022-09-03 DIAGNOSIS — Z79899 Other long term (current) drug therapy: Secondary | ICD-10-CM | POA: Insufficient documentation

## 2022-09-03 DIAGNOSIS — Z23 Encounter for immunization: Secondary | ICD-10-CM | POA: Insufficient documentation

## 2022-09-03 MED ORDER — BUPIVACAINE HCL (PF) 0.5 % IJ SOLN
10.0000 mL | Freq: Once | INTRAMUSCULAR | Status: AC
  Administered 2022-09-03: 10 mL
  Filled 2022-09-03: qty 10

## 2022-09-03 MED ORDER — HYDROCODONE-ACETAMINOPHEN 5-325 MG PO TABS: 1.0000 | ORAL_TABLET | Freq: Four times a day (QID) | ORAL | 0 refills | Status: AC | PRN

## 2022-09-03 MED ORDER — TETANUS-DIPHTH-ACELL PERTUSSIS 5-2.5-18.5 LF-MCG/0.5 IM SUSY
0.5000 mL | PREFILLED_SYRINGE | Freq: Once | INTRAMUSCULAR | Status: AC
  Administered 2022-09-03: 0.5 mL via INTRAMUSCULAR
  Filled 2022-09-03: qty 0.5

## 2022-09-03 NOTE — Discharge Instructions (Addendum)
You were evaluated today for finger laceration.  This was repaired with 3 sutures.  These should be removed in 7 to 10 days.  I have provided a prescription for a short course of pain medication to be used as needed.  You may follow-up with your primary care provider or any other medical provider for removal of sutures.

## 2022-09-03 NOTE — ED Provider Notes (Signed)
[facility] [facility] [place] [facility] Provider Note   CSN: [id number] Arrival date & time: 09/03/22  1651     History  Chief Complaint  Patient presents with   Laceration    John Bell is a 62 y.o. male.  Patient presents to the emergency department complaining of a laceration to the middle finger of the right hand.  Laceration is just proximal to the DIP.  Bleeding was controlled upon arrival.  Patient is unclear of his last tetanus vaccination.  Past medical history significant for legally blind  HPI     Home Medications Prior to Admission medications   Medication Sig Start Date End Date Taking? Authorizing Provider  HYDROcodone-acetaminophen (NORCO/VICODIN) 5-325 MG tablet Take 1 tablet by mouth every 6 (six) hours as needed. 09/03/22  Yes Dorothyann Peng, PA-C  acyclovir (ZOVIRAX) 400 MG tablet Take 1 tablet (400 mg total) by mouth 4 (four) times daily. 06/24/22   Harriet Pho, PA-C  ALPRAZolam Duanne Moron) 0.5 MG tablet Take 0.5 mg by mouth daily as needed. 07/26/19   [provider]  amLODipine (NORVASC) 10 MG tablet TAKE ONE TABLET BY MOUTH ONCE DAILY 03/14/21   Buford Dresser, MD  brimonidine (ALPHAGAN) 0.2 % ophthalmic solution Place 1 drop into both eyes daily.  10/12/14   [provider]  dorzolamide (TRUSOPT) 2 % ophthalmic solution Place 1 drop into both eyes 2 (two) times daily. 03/26/15   [provider]  dorzolamide-timolol (COSOPT) 22.3-6.8 MG/ML ophthalmic solution  08/30/19   [provider]  latanoprost (XALATAN) 0.005 % ophthalmic solution Apply 1 drop to eye at bedtime. 09/30/16   [provider]  methocarbamol (ROBAXIN) 500 MG tablet Take 1 tablet (500 mg total) by mouth at bedtime as needed for muscle spasms. 09/20/19   Caccavale, Sophia, PA-C  Omega-3 Fatty Acids (OMEGA 3 PO) Take 2 capsules by mouth 2 (two) times daily.    [provider]  silver sulfADIAZINE (SILVADENE) 1 % cream  Apply 1 application topically daily. 06/27/21   Deatra Canter, Amjad, PA-C  timolol (BETIMOL) 0.25 % ophthalmic solution Place 1 drop into both eyes at bedtime.    [provider]  timolol (TIMOPTIC) 0.5 % ophthalmic solution Place 1 drop into both eyes 2 (two) times daily. 03/29/14   [provider]      Allergies    Codeine    Review of Systems   Review of Systems  Physical Exam Updated Vital Signs There were no vitals taken for this visit. Physical Exam HENT:     Head: Normocephalic and atraumatic.  Eyes:     Conjunctiva/sclera: Conjunctivae normal.  Pulmonary:     Effort: Pulmonary effort is normal. No respiratory distress.  Musculoskeletal:        General: Signs of injury present.     Cervical back: Normal range of motion.     Comments: Laceration proximal to the DIP of the middle finger of the right hand.  Patient able to actively move the distal portion of the phalange.  Sensation is intact.  Brisk cap refill.  Skin:    General: Skin is dry.  Neurological:     Mental Status: He is alert.  Psychiatric:        Speech: Speech normal.        Behavior: Behavior normal.     ED Results / Procedures / Treatments   Labs (all labs ordered are listed, but only abnormal results are displayed) Labs Reviewed - No data to  display  EKG None  Radiology No results found.  Procedures .Marland KitchenLaceration Repair  Date/Time: 09/03/2022 9:20 PM  Performed by: Dorothyann Peng, PA-C Authorized by: Dorothyann Peng, PA-C   Consent:    Consent obtained:  Verbal   Consent given by:  Patient   Risks, benefits, and alternatives were discussed: yes     Risks discussed:  Infection, pain, tendon damage, vascular damage, poor wound healing, poor cosmetic result, nerve damage and need for additional repair   Alternatives discussed:  No treatment Universal protocol:    Procedure explained and questions answered to patient or proxy's satisfaction: yes     Relevant documents present  and verified: yes     Required blood products, implants, devices, and special equipment available: yes     Immediately prior to procedure, a time out was called: yes     Patient identity confirmed:  Verbally with patient Anesthesia:    Anesthesia method:  Nerve block   Block location:  Long finger of right hand   Block needle gauge:  25 G   Block anesthetic:  Bupivacaine 0.5% w/o epi   Block technique:  Ring block   Block injection procedure:  Anatomic landmarks identified, introduced needle, incremental injection and negative aspiration for blood   Block outcome:  Anesthesia achieved Laceration details:    Location:  Finger   Finger location:  R long finger   Length (cm):  2.8   Depth (mm):  4 Pre-procedure details:    Preparation:  Patient was prepped and draped in usual sterile fashion Exploration:    Hemostasis achieved with:  Direct pressure   Wound exploration: wound explored through full range of motion and entire depth of wound visualized   Treatment:    Area cleansed with:  Saline   Amount of cleaning:  Standard   Irrigation solution:  Sterile saline Skin repair:    Repair method:  Sutures   Suture size:  5-0   Suture material:  Prolene   Number of sutures:  3 Approximation:    Approximation:  Close Repair type:    Repair type:  Simple Post-procedure details:    Dressing:  Non-adherent dressing   Procedure completion:  Tolerated well, no immediate complications     Medications Ordered in ED Medications  bupivacaine(PF) (MARCAINE) 0.5 % injection 10 mL (10 mLs Infiltration Given 09/03/22 2001)  Tdap (BOOSTRIX) injection 0.5 mL (0.5 mLs Intramuscular Given 09/03/22 1958)    ED Course/ Medical Decision Making/ A&P                             Medical Decision Making Risk Prescription drug management.   Patient presented with a chief complaint of finger laceration.  This was repaired with sutures as noted in the procedure note.  The patient was administered a  Tdap booster as his last immunization timeframe is unknown.  He tolerated the procedure well.  Patient's finger has normal mobility, brisk cap refill, normal sensation.  No concern at this time for ligamentous, tendon, nerve injury.  Plan to discharge home at this time.  Will provide short course of pain medication.  Return precautions provided.  Patient will have sutures removed in 7 to 10 days.        Final Clinical Impression(s) / ED Diagnoses Final diagnoses:  Laceration of right middle finger without foreign body without damage to nail, initial encounter    Rx / DC Orders ED Discharge Orders  Ordered    HYDROcodone-acetaminophen (NORCO/VICODIN) 5-325 MG tablet  Every 6 hours PRN        09/03/22 2155              Ronny Bacon 09/03/22 2155    Tegeler, Gwenyth Allegra, MD 09/03/22 [phone]

## 2022-09-03 NOTE — ED Triage Notes (Signed)
Pt cut himself with a kitchen knife on his middle finger, bleeding controlled, gauze applied.  Last tetanus within last 10 years.

## 2022-09-04 DIAGNOSIS — R69 Illness, unspecified: Secondary | ICD-10-CM | POA: Diagnosis not present

## 2022-09-04 DIAGNOSIS — F4322 Adjustment disorder with anxiety: Secondary | ICD-10-CM | POA: Diagnosis not present

## 2022-09-11 DIAGNOSIS — Z4802 Encounter for removal of sutures: Secondary | ICD-10-CM | POA: Diagnosis not present

## 2022-09-21 DIAGNOSIS — F4322 Adjustment disorder with anxiety: Secondary | ICD-10-CM | POA: Diagnosis not present

## 2022-09-21 DIAGNOSIS — R69 Illness, unspecified: Secondary | ICD-10-CM | POA: Diagnosis not present

## 2022-10-02 DIAGNOSIS — F4322 Adjustment disorder with anxiety: Secondary | ICD-10-CM | POA: Diagnosis not present

## 2022-10-16 DIAGNOSIS — F4322 Adjustment disorder with anxiety: Secondary | ICD-10-CM | POA: Diagnosis not present

## 2022-10-20 DIAGNOSIS — Z0000 Encounter for general adult medical examination without abnormal findings: Secondary | ICD-10-CM | POA: Diagnosis not present

## 2022-10-20 DIAGNOSIS — R748 Abnormal levels of other serum enzymes: Secondary | ICD-10-CM | POA: Diagnosis not present

## 2022-10-20 DIAGNOSIS — Z6832 Body mass index (BMI) 32.0-32.9, adult: Secondary | ICD-10-CM | POA: Diagnosis not present

## 2022-10-20 DIAGNOSIS — J309 Allergic rhinitis, unspecified: Secondary | ICD-10-CM | POA: Diagnosis not present

## 2022-10-20 DIAGNOSIS — E559 Vitamin D deficiency, unspecified: Secondary | ICD-10-CM | POA: Diagnosis not present

## 2022-10-20 DIAGNOSIS — Z1321 Encounter for screening for nutritional disorder: Secondary | ICD-10-CM | POA: Diagnosis not present

## 2022-10-20 DIAGNOSIS — H6123 Impacted cerumen, bilateral: Secondary | ICD-10-CM | POA: Diagnosis not present

## 2022-10-30 DIAGNOSIS — F4322 Adjustment disorder with anxiety: Secondary | ICD-10-CM | POA: Diagnosis not present

## 2022-12-11 DIAGNOSIS — F4322 Adjustment disorder with anxiety: Secondary | ICD-10-CM | POA: Diagnosis not present

## 2022-12-25 DIAGNOSIS — F4322 Adjustment disorder with anxiety: Secondary | ICD-10-CM | POA: Diagnosis not present

## 2023-01-22 DIAGNOSIS — F4322 Adjustment disorder with anxiety: Secondary | ICD-10-CM | POA: Diagnosis not present

## 2023-02-05 DIAGNOSIS — F4322 Adjustment disorder with anxiety: Secondary | ICD-10-CM | POA: Diagnosis not present

## 2023-02-19 DIAGNOSIS — F4322 Adjustment disorder with anxiety: Secondary | ICD-10-CM | POA: Diagnosis not present

## 2023-10-13 DIAGNOSIS — M5459 Other low back pain: Secondary | ICD-10-CM | POA: Diagnosis not present

## 2023-10-13 DIAGNOSIS — M79651 Pain in right thigh: Secondary | ICD-10-CM | POA: Diagnosis not present

## 2023-10-13 DIAGNOSIS — M25551 Pain in right hip: Secondary | ICD-10-CM | POA: Diagnosis not present

## 2023-10-13 DIAGNOSIS — M1611 Unilateral primary osteoarthritis, right hip: Secondary | ICD-10-CM | POA: Diagnosis not present

## 2023-10-13 DIAGNOSIS — M25561 Pain in right knee: Secondary | ICD-10-CM | POA: Diagnosis not present

## 2023-10-13 DIAGNOSIS — M79661 Pain in right lower leg: Secondary | ICD-10-CM | POA: Diagnosis not present

## 2023-10-27 DIAGNOSIS — Z125 Encounter for screening for malignant neoplasm of prostate: Secondary | ICD-10-CM | POA: Diagnosis not present

## 2023-10-27 DIAGNOSIS — R748 Abnormal levels of other serum enzymes: Secondary | ICD-10-CM | POA: Diagnosis not present

## 2023-10-27 DIAGNOSIS — I10 Essential (primary) hypertension: Secondary | ICD-10-CM | POA: Diagnosis not present

## 2023-10-27 DIAGNOSIS — E782 Mixed hyperlipidemia: Secondary | ICD-10-CM | POA: Diagnosis not present

## 2023-10-27 DIAGNOSIS — E559 Vitamin D deficiency, unspecified: Secondary | ICD-10-CM | POA: Diagnosis not present

## 2023-11-10 DIAGNOSIS — M25512 Pain in left shoulder: Secondary | ICD-10-CM | POA: Diagnosis not present

## 2023-11-10 DIAGNOSIS — M9901 Segmental and somatic dysfunction of cervical region: Secondary | ICD-10-CM | POA: Diagnosis not present

## 2023-11-10 DIAGNOSIS — M25551 Pain in right hip: Secondary | ICD-10-CM | POA: Diagnosis not present

## 2023-11-10 DIAGNOSIS — M9906 Segmental and somatic dysfunction of lower extremity: Secondary | ICD-10-CM | POA: Diagnosis not present

## 2023-11-10 DIAGNOSIS — M9907 Segmental and somatic dysfunction of upper extremity: Secondary | ICD-10-CM | POA: Diagnosis not present

## 2023-11-10 DIAGNOSIS — M9902 Segmental and somatic dysfunction of thoracic region: Secondary | ICD-10-CM | POA: Diagnosis not present

## 2023-11-10 DIAGNOSIS — M25511 Pain in right shoulder: Secondary | ICD-10-CM | POA: Diagnosis not present

## 2023-11-10 DIAGNOSIS — M9904 Segmental and somatic dysfunction of sacral region: Secondary | ICD-10-CM | POA: Diagnosis not present

## 2023-11-16 DIAGNOSIS — H401133 Primary open-angle glaucoma, bilateral, severe stage: Secondary | ICD-10-CM | POA: Diagnosis not present

## 2023-11-16 DIAGNOSIS — H4423 Degenerative myopia, bilateral: Secondary | ICD-10-CM | POA: Diagnosis not present

## 2023-12-31 DIAGNOSIS — M9908 Segmental and somatic dysfunction of rib cage: Secondary | ICD-10-CM | POA: Diagnosis not present

## 2023-12-31 DIAGNOSIS — M62838 Other muscle spasm: Secondary | ICD-10-CM | POA: Diagnosis not present

## 2023-12-31 DIAGNOSIS — M5459 Other low back pain: Secondary | ICD-10-CM | POA: Diagnosis not present

## 2023-12-31 DIAGNOSIS — M9902 Segmental and somatic dysfunction of thoracic region: Secondary | ICD-10-CM | POA: Diagnosis not present

## 2023-12-31 DIAGNOSIS — M9904 Segmental and somatic dysfunction of sacral region: Secondary | ICD-10-CM | POA: Diagnosis not present

## 2023-12-31 DIAGNOSIS — M25551 Pain in right hip: Secondary | ICD-10-CM | POA: Diagnosis not present

## 2023-12-31 DIAGNOSIS — M9905 Segmental and somatic dysfunction of pelvic region: Secondary | ICD-10-CM | POA: Diagnosis not present

## 2023-12-31 DIAGNOSIS — M9906 Segmental and somatic dysfunction of lower extremity: Secondary | ICD-10-CM | POA: Diagnosis not present

## 2023-12-31 DIAGNOSIS — M9903 Segmental and somatic dysfunction of lumbar region: Secondary | ICD-10-CM | POA: Diagnosis not present

## 2024-02-25 DIAGNOSIS — M9906 Segmental and somatic dysfunction of lower extremity: Secondary | ICD-10-CM | POA: Diagnosis not present

## 2024-02-25 DIAGNOSIS — M9904 Segmental and somatic dysfunction of sacral region: Secondary | ICD-10-CM | POA: Diagnosis not present

## 2024-02-25 DIAGNOSIS — M160 Bilateral primary osteoarthritis of hip: Secondary | ICD-10-CM | POA: Diagnosis not present

## 2024-02-25 DIAGNOSIS — M25551 Pain in right hip: Secondary | ICD-10-CM | POA: Diagnosis not present

## 2024-02-25 DIAGNOSIS — M9905 Segmental and somatic dysfunction of pelvic region: Secondary | ICD-10-CM | POA: Diagnosis not present
